# Patient Record
Sex: Female | Born: 1980
Health system: Southern US, Community
[De-identification: ages and names within clinical notes are randomized; demographics above are authoritative.]

## PROBLEM LIST (undated history)

## (undated) DIAGNOSIS — D573 Sickle-cell trait: Secondary | ICD-10-CM

## (undated) DIAGNOSIS — E785 Hyperlipidemia, unspecified: Secondary | ICD-10-CM

## (undated) DIAGNOSIS — J309 Allergic rhinitis, unspecified: Secondary | ICD-10-CM

## (undated) DIAGNOSIS — D649 Anemia, unspecified: Secondary | ICD-10-CM

## (undated) DIAGNOSIS — R87629 Unspecified abnormal cytological findings in specimens from vagina: Secondary | ICD-10-CM

## (undated) DIAGNOSIS — D509 Iron deficiency anemia, unspecified: Secondary | ICD-10-CM

## (undated) DIAGNOSIS — Z8669 Personal history of other diseases of the nervous system and sense organs: Secondary | ICD-10-CM

## (undated) HISTORY — PX: DILATION AND CURETTAGE OF UTERUS: SHX78

## (undated) HISTORY — DX: Allergic rhinitis, unspecified: J30.9

## (undated) HISTORY — PX: TUBAL LIGATION: SHX77

## (undated) HISTORY — DX: Iron deficiency anemia, unspecified: D50.9

## (undated) HISTORY — PX: INCISION AND DRAINAGE PERIRECTAL ABSCESS: SHX1804

## (undated) HISTORY — DX: Personal history of other diseases of the nervous system and sense organs: Z86.69

## (undated) HISTORY — PX: WISDOM TOOTH EXTRACTION: SHX21

---
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
## 2002-08-03 ENCOUNTER — Other Ambulatory Visit: Admission: RE | Admit: 2002-08-03 | Discharge: 2002-08-03 | Payer: Self-pay | Admitting: Obstetrics

## 2002-11-04 ENCOUNTER — Ambulatory Visit (HOSPITAL_COMMUNITY): Admission: RE | Admit: 2002-11-04 | Discharge: 2002-11-04 | Payer: Self-pay | Admitting: Obstetrics

## 2002-11-04 ENCOUNTER — Encounter: Payer: Self-pay | Admitting: Obstetrics

## 2002-11-25 ENCOUNTER — Inpatient Hospital Stay (HOSPITAL_COMMUNITY): Admission: AD | Admit: 2002-11-25 | Discharge: 2002-11-25 | Payer: Self-pay | Admitting: Obstetrics

## 2003-03-03 ENCOUNTER — Inpatient Hospital Stay (HOSPITAL_COMMUNITY): Admission: AD | Admit: 2003-03-03 | Discharge: 2003-03-07 | Payer: Self-pay | Admitting: Obstetrics & Gynecology

## 2003-03-20 ENCOUNTER — Inpatient Hospital Stay (HOSPITAL_COMMUNITY): Admission: AD | Admit: 2003-03-20 | Discharge: 2003-03-22 | Payer: Self-pay | Admitting: Obstetrics

## 2004-03-29 ENCOUNTER — Emergency Department (HOSPITAL_COMMUNITY): Admission: EM | Admit: 2004-03-29 | Discharge: 2004-03-29 | Payer: Self-pay | Admitting: Emergency Medicine

## 2005-04-19 ENCOUNTER — Emergency Department (HOSPITAL_COMMUNITY): Admission: EM | Admit: 2005-04-19 | Discharge: 2005-04-19 | Payer: Self-pay | Admitting: Emergency Medicine

## 2005-10-26 ENCOUNTER — Emergency Department (HOSPITAL_COMMUNITY): Admission: EM | Admit: 2005-10-26 | Discharge: 2005-10-26 | Payer: Self-pay | Admitting: Emergency Medicine

## 2006-11-26 HISTORY — PX: INCISION AND DRAINAGE PERIRECTAL ABSCESS: SHX1804

## 2008-03-08 ENCOUNTER — Inpatient Hospital Stay (HOSPITAL_COMMUNITY): Admission: AD | Admit: 2008-03-08 | Discharge: 2008-03-09 | Payer: Self-pay | Admitting: Surgery

## 2008-11-15 ENCOUNTER — Inpatient Hospital Stay (HOSPITAL_COMMUNITY): Admission: AD | Admit: 2008-11-15 | Discharge: 2008-11-15 | Payer: Self-pay | Admitting: Obstetrics & Gynecology

## 2008-11-17 ENCOUNTER — Inpatient Hospital Stay (HOSPITAL_COMMUNITY): Admission: AD | Admit: 2008-11-17 | Discharge: 2008-11-17 | Payer: Self-pay | Admitting: Obstetrics & Gynecology

## 2008-12-15 ENCOUNTER — Inpatient Hospital Stay (HOSPITAL_COMMUNITY): Admission: AD | Admit: 2008-12-15 | Discharge: 2008-12-15 | Payer: Self-pay | Admitting: Obstetrics

## 2010-02-10 ENCOUNTER — Encounter: Admission: RE | Admit: 2010-02-10 | Discharge: 2010-02-10 | Payer: Self-pay | Admitting: Family Medicine

## 2011-03-12 LAB — URINALYSIS, ROUTINE W REFLEX MICROSCOPIC
Bilirubin Urine: NEGATIVE
Glucose, UA: NEGATIVE mg/dL
Hgb urine dipstick: NEGATIVE
Ketones, ur: >80 mg/dL — AB
Leukocytes, UA: NEGATIVE
Nitrite: NEGATIVE
Protein, ur: 30 mg/dL — AB
Specific Gravity, Urine: >1.03 — ABNORMAL HIGH (ref 1.005–1.030)
Urobilinogen, UA: 0.2 mg/dL (ref 0.0–1.0)
pH: 5.5 (ref 5.0–8.0)

## 2011-03-12 LAB — CBC
HCT: 34.8 % — ABNORMAL LOW (ref 36.0–46.0)
Hemoglobin: 11.1 g/dL — ABNORMAL LOW (ref 12.0–15.0)
MCHC: 31.8 g/dL (ref 30.0–36.0)
MCV: 68.9 fL — ABNORMAL LOW (ref 78.0–100.0)
Platelets: 265 10*3/uL (ref 150–400)
RBC: 5.04 MIL/uL (ref 3.87–5.11)
RDW: 19.4 % — ABNORMAL HIGH (ref 11.5–15.5)
WBC: 6.6 10*3/uL (ref 4.0–10.5)

## 2011-03-12 LAB — COMPREHENSIVE METABOLIC PANEL
Albumin: 4.2 g/dL (ref 3.5–5.2)
Alkaline Phosphatase: 44 U/L (ref 39–117)
BUN: 8 mg/dL (ref 6–23)
CO2: 28 mEq/L (ref 19–32)
Chloride: 105 mEq/L (ref 96–112)
Potassium: 3.6 mEq/L (ref 3.5–5.1)
Total Bilirubin: 0.5 mg/dL (ref 0.3–1.2)

## 2011-03-12 LAB — URINE MICROSCOPIC-ADD ON

## 2011-04-10 NOTE — Op Note (Signed)
NAME:  Katrina Wiley, Katrina Wiley             ACCOUNT NO.:  [id number]   MEDICAL RECORD NO.:  [id number]          PATIENT TYPE:  INP   LOCATION:  1502                         FACILITY:  [facility]   PHYSICIAN:  Sandria Bales. Ezzard Standing, M.D.  DATE OF BIRTH:  [birth year]   DATE OF PROCEDURE:  03/08/2008  DATE OF DISCHARGE:                               OPERATIVE REPORT   Date of surgery ???   PREOPERATIVE DIAGNOSIS:  Left posterior perianal abscess.   POSTOPERATIVE DIAGNOSIS:  Left posterior perianal abscess.   PROCEDURE:  Incision and drainage of left posterior perianal abscess.   SURGEON:  Sandria Bales. Ezzard Standing, MD   ANESTHESIA:  General endotracheal.   ESTIMATED BLOOD LOSS:  50 mL.   DRAINS LEFT:  I left a pack in the wound.   INDICATIONS FOR PROCEDURE:  Ms. Katrina Wiley is a 30 year old black female who  has had a 5 day history of an increasing left posterior buttocks  swelling.  She actually had this in August of 2007.  Apparently is  spontaneously drained.  She sees Dr. Merri Brunette as her primary  medical doctor.  She was seen by Dr. Bertram Savin in an urgent office  today.  Dr. Freida Busman thought it was too big to drain in the office and  asked me to drain it since I was on-call tonight.   The indications and potential complications were explained to the  patient.  Potential complications include bleeding, infection which she  already has, recurrence of this abscess and I told her there was a  chance she had some fissure which is causing recurrence, but I think  this is probably is a true perianal/perirectal abscess.   DESCRIPTION OF PROCEDURE:  The patient was placed in lithotomy position.  A time out held identifying the patient and the procedure.  Her buttocks  was prepped with Betadine solution and sterilely draped.  An incision  was  made into pus which was under pressure.  Cultures were obtained.  I  irrigated the wound with saline, packed with Betadine gauze, serially  dressed it.   She will be  kept overnight, remove the packing tomorrow with plan for  sitz baths 3 times a day.      Sandria Bales. Ezzard Standing, M.D.  Electronically Signed     DHN/MEDQ  D:  03/08/2008  T:  03/08/2008  Job:  [id number]   cc:   Dario Guardian, M.D.  Fax: [phone]

## 2011-04-10 NOTE — H&P (Signed)
NAME:  Katrina Wiley, Lincoln             ACCOUNT NO.:  [id number]   MEDICAL RECORD NO.:  [id number]          PATIENT TYPE:  INP   LOCATION:  1502                         FACILITY:  [facility]   PHYSICIAN:  Lennie Muckle, MD      DATE OF BIRTH:  04/05/1981   DATE OF ADMISSION:  03/08/2008  DATE OF DISCHARGE:                              HISTORY & PHYSICAL   ADMISSION DIAGNOSIS:  Perirectal abscess.   HISTORY OF PRESENT ILLNESS:  Ms. Katrina Wiley is a 30 year old female, who had  apparently 5 days of perirectal pain and swelling.  She had fevers at  home temperature of 99.  She was seen at her physician's office today at  [facility].  It was thought that she had a pilonidal  cyst.  She has had a previous drainage in August of last year; however,  recently again the swelling was approximately 5 days ago, and she has  continued to have pain and swelling in her left buttocks.   PAST MEDICAL HISTORY:  Negative.   SURGICAL HISTORY:  None.   MEDICATIONS:  Takes no medications.   ALLERGIES:  No drug allergies.   SOCIAL HISTORY:  No tobacco or alcohol use.   FAMILY HISTORY:  Negative.   REVIEW OF SYSTEMS:  Negative.   PHYSICAL EXAMINATION:  GENERAL:  She is a pleasant young female, in no  acute distress.  VITAL SIGNS:  Blood pressure 132/75, pulse 111, temp 98.8, and weight is  122.  HEENT:  Extraocular muscles are intact.  CHEST:  Clear to auscultation bilaterally.  CARDIOVASCULAR:  Regular rate and rhythm.  ABDOMEN:  Soft, nontender, and nondistended.  EXTREMITIES:  No deformity or edema.  RECTAL:  Perirectal examination reveals swelling approximately 7 cm in  length and approximately 4 cm in width on the left buttocks.  There is  tenderness with palpation, question of erythema in the area, I see no  tract in the midline for a fistula tract.   ASSESSMENT AND PLAN:  Perirectal abscess.  I have discussed this with  Dr. Ovidio Kin who is on the [facility], and then  she needs  to have incision and drainage of this area as well as perirectal  examination to ensure there is no other fistula tract.  I discussed this  with the patient briefly, and we will have her go to the admitting  office.  She will receive Zosyn preoperatively, and will probably be  able to go home tomorrow following her incision and drainage.      Lennie Muckle, MD  Electronically Signed     ALA/MEDQ  D:  03/08/2008  T:  03/09/2008  Job:  [id number]

## 2011-04-13 NOTE — Discharge Summary (Signed)
NAME:  Katrina Wiley, Katrina Wiley                       ACCOUNT NO.:  [id number]   MEDICAL RECORD NO.:  [id number]                   PATIENT TYPE:  INP   LOCATION:  9159                                 FACILITY:  [facility]   PHYSICIAN:  Charles A. Clearance Coots, M.D.             DATE OF BIRTH:  13-Aug-1981   DATE OF ADMISSION:  03/03/2003  DATE OF DISCHARGE:  03/07/2003                                 DISCHARGE SUMMARY   ADMISSION DIAGNOSES:  1. Gestation at 35 weeks.  2. Leukorrhea.  3. Preterm cervical changes.  4. History of 36 weeks' delivery with previous pregnancy.   DISCHARGE DIAGNOSES:  1. Gestation at 35 weeks.  2. Leukorrhea.  3. Preterm cervical changes.  4. History of 36 weeks' delivery with previous pregnancy.   DISPOSITION:  Discharged home in good condition, not in labor, undelivered  at 36-1/7 weeks' gestation.   REASON FOR ADMISSION:  [age]-year-old G1, P0-1-0-1 black female, estimated  date of confinement of Apr 05, 2003, presented to the office with complaint  of lower back and lower abdominal pain.  On examination was found to be have  moderate amount of leukorrhea and preterm cervical dilatation of 3 cm and  significant cervical effacement.  A decision was made to admit for IV  antibiotic therapy along with bed rest and observation.   PAST SURGICAL HISTORY:  None.   PAST MEDICAL HISTORY:  Sickle cell trait.   MEDICATIONS:  Prenatal vitamins.   ALLERGIES:  No known drug allergies.   FAMILY HISTORY:  Sickle cell trait.   PHYSICAL EXAMINATION:  GENERAL:  Slim, black female in no acute distress.  VITAL SIGNS:  Temperature 99.5, pulse 88, blood pressure 115/70.  LUNGS:  Clear to auscultation bilaterally.  HEART:  Regular rate and rhythm.  ABDOMEN:  Gravid,soft, nontender.  PELVIC:  Normal external female genitalia.  Vagina was moderate grayish  discharge.  Cervix 3+ cm dilated, 80% effaced and the vertex at a -1 to 0  station.  Uterus gravida, soft, nontender.  EXTREMITIES:  Without cyanosis, clubbing or edema.   ADMITTING LABORATORY VALUES:  Wet prep loaded with white blood cells,  negative whiff test.  Hemoglobin 10.5, hematocrit 32.3, white blood cell  count 7900, platelets 147,000.   HOSPITAL COURSE:  The patient was admitted and responded well to IV  antibiotics and IV fluid therapy along with bed rest.  She continued to have  occasional uterine contractions that were mild but did not have any  significant cervical change on examination.  She was therefore discharged  home on hospital day #4, undelivered at 36-1/7 weeks' gestation with  instructions.   DISCHARGE MEDICATIONS:  Continue prenatal vitamins.   DISCHARGE INSTRUCTIONS:  Routine written instructions for obstetrical  booklet were given for diet, and she is to have bed rest modified bed rest.  Follow up at the [facility] on Wednesday, March 10, 2003.  Charles A. Clearance Coots, M.D.    CAH/MEDQ  D:  03/07/2003  T:  03/07/2003  Job:  [id number]   cc:   [facility]  Attn:  Coral Ceo, M.D.

## 2011-04-13 NOTE — Discharge Summary (Signed)
NAME:  Wiley, Katrina N                       ACCOUNT NO.:  [id number]   MEDICAL RECORD NO.:  [id number]                   PATIENT TYPE:  INP   LOCATION:  9323                                 FACILITY:  [facility]   PHYSICIAN:  Charles A. Clearance Coots, M.D.             DATE OF BIRTH:  [birth year]   DATE OF ADMISSION:  03/20/2003  DATE OF DISCHARGE:  03/22/2003                                 DISCHARGE SUMMARY   ADMISSION DIAGNOSIS:  [redacted] weeks gestation, active labor.   DISCHARGE DIAGNOSES:  1. [redacted] weeks gestation, active labor.  2. Status post normal spontaneous vaginal delivery of a viable female on     03/20/03 at 0733, Apgar's at 9 at one minute, 9 at five minutes and weight     of 3170 grams, length of 49.5 cm.  Mother and infant discharged home in     good condition.   REASON FOR ADMISSION:  30 year old black female G2, P1, estimated date of  confinement of 04/05/03 presents with strong regular uterine contractions.  Denied fever, chills, nausea, vomiting, dysuria or rupture of membranes.   OB HISTORY:  Prenatal care was benign and only remarkable for preterm labor  and positive group B strep culture at [redacted] weeks gestation.  The patient had a  previous OB history of preterm labor and delivery at approximately [redacted] weeks  gestation, uncomplicated.   PAST MEDICAL HISTORY:   SURGERY:  None.   ILLNESSES:  None.   MEDICATIONS:  Prenatal vitamins and Unasyn.   ALLERGIES:  No known drug allergies.   SOCIAL HISTORY:  She is single.  Negative tobacco, alcohol or recreational  drug use.   PHYSICAL EXAMINATION:  VITAL SIGNS: Afebrile, blood pressure was 134/79.  ABDOMEN: Soft, gravid, nontender.  CERVICAL EXAM: Cervix is fully dilated, 100% effaced, and vertex at +2  station.   IMPRESSION:  [redacted] weeks gestation, positive group B strep, active labor.   PLAN:  Will try to give Penicillin prophylaxis for group B strep.   ADMITTING LABORATORY VALUES:  Hemoglobin 10.5, hematocrit 32.1, WBC  count  9300, platelets 158,000.  RPR was nonreactive.   HOSPITAL COURSE:  The patient progressed to normal spontaneous vaginal  delivery of a viable female at 0733 on 03/20/03.  There were no intrapartum  complications.  Postpartum course was uncomplicated and the patient was  discharged home on postpartum day number two in good condition.   DISCHARGE LABORATORY VALUES:  Hemoglobin 10, hematocrit 30, WBC count  12,900, platelets 174,000.   DISPOSITION:   MEDICATIONS:  Continue prenatal vitamins, Ibuprofen 800 mg q.8h p.r.n. for  pain.  Routine written instructions were given for diet and activity.  The patient  is to follow up at [facility] in six weeks.  Charles A. Clearance Coots, M.D.    CAH/MEDQ  D:  03/22/2003  T:  03/22/2003  Job:  [id number]

## 2011-08-02 ENCOUNTER — Inpatient Hospital Stay (HOSPITAL_COMMUNITY)
Admission: AD | Admit: 2011-08-02 | Discharge: 2011-08-02 | Disposition: A | Discharge status: Home / Self Care | Payer: 59 | Source: Ambulatory Visit | Attending: Obstetrics & Gynecology | Admitting: Obstetrics & Gynecology

## 2011-08-02 ENCOUNTER — Encounter (HOSPITAL_COMMUNITY): Payer: Self-pay

## 2011-08-02 DIAGNOSIS — O210 Mild hyperemesis gravidarum: Secondary | ICD-10-CM | POA: Insufficient documentation

## 2011-08-02 HISTORY — DX: Anemia, unspecified: D64.9

## 2011-08-02 LAB — COMPREHENSIVE METABOLIC PANEL
ALT: 7 U/L (ref 0–35)
AST: 14 U/L (ref 0–37)
Calcium: 9.9 mg/dL (ref 8.4–10.5)
Sodium: 137 mEq/L (ref 135–145)
Total Protein: 8 g/dL (ref 6.0–8.3)

## 2011-08-02 LAB — URINALYSIS, ROUTINE W REFLEX MICROSCOPIC
Bilirubin Urine: NEGATIVE
Protein, ur: NEGATIVE mg/dL
Urobilinogen, UA: 1 mg/dL (ref 0.0–1.0)

## 2011-08-02 LAB — DIFFERENTIAL
Basophils Absolute: 0 10*3/uL (ref 0.0–0.1)
Eosinophils Absolute: 0 10*3/uL (ref 0.0–0.7)
Eosinophils Relative: 0 % (ref 0–5)

## 2011-08-02 LAB — CBC
MCH: 23.7 pg — ABNORMAL LOW (ref 26.0–34.0)
MCV: 72.2 fL — ABNORMAL LOW (ref 78.0–100.0)
Platelets: 195 10*3/uL (ref 150–400)
RDW: 16.5 % — ABNORMAL HIGH (ref 11.5–15.5)

## 2011-08-02 LAB — URINE MICROSCOPIC-ADD ON

## 2011-08-02 MED ORDER — PROMETHAZINE HCL 25 MG PO TABS: 25.0000 mg | ORAL_TABLET | Freq: Four times a day (QID) | ORAL | Status: AC | PRN

## 2011-08-02 MED ORDER — PROMETHAZINE HCL 25 MG/ML IJ SOLN
25.0000 mg | Freq: Once | INTRAMUSCULAR | Status: AC
  Administered 2011-08-02: 25 mg via INTRAVENOUS
  Filled 2011-08-02: qty 1

## 2011-08-02 MED ORDER — LACTATED RINGERS IV BOLUS (SEPSIS)
1000.0000 mL | Freq: Once | INTRAVENOUS | Status: AC
  Administered 2011-08-02: 1000 mL via INTRAVENOUS

## 2011-08-02 NOTE — Progress Notes (Signed)
Pt states she has had a 15 lb weight loss in 2 weeks. Feels weak and cannot stand up for more than 5 minutes.

## 2011-08-02 NOTE — ED Provider Notes (Signed)
History   Pt presents today c/o severe N&V. She states she has had these sx for the past 2wks and she thinks she has lost about 15lbs. She was seen at a hospital in [place] and was given IV fluids and zofran but she states it did not seem to help. She denies fever, dysuria, abd pain, vag dc or bleeding.  Chief Complaint  Patient presents with  . Emesis During Pregnancy   HPI  OB History    Grav Para Term Preterm Abortions TAB SAB Ect Mult Living   4 2 1 1 1 1    2       Past Medical History  Diagnosis Date  . Preterm labor   . Anemia     Past Surgical History  Procedure Date  . Wisdom tooth extraction   . Dilation and curettage of uterus   . Incision and drainage perirectal abscess     No family history on file.  History  Substance Use Topics  . Smoking status: Never Smoker   . Smokeless tobacco: Never Used  . Alcohol Use: No    Allergies: No Known Allergies  No prescriptions prior to admission    Review of Systems  Constitutional: Negative for fever.  Cardiovascular: Negative for chest pain.  Gastrointestinal: Positive for nausea and vomiting. Negative for abdominal pain and diarrhea.  Genitourinary: Negative for dysuria, urgency, frequency and hematuria.  Neurological: Negative for dizziness and headaches.  Psychiatric/Behavioral: Negative for depression and suicidal ideas.   Physical Exam   Blood pressure 128/90, pulse 109, temperature 99.2 F (37.3 C), temperature source Oral, resp. rate 16, height 5\' 3"  (1.6 m), weight 106 lb 12.8 oz (48.444 kg), last menstrual period 06/09/2011, SpO2 99.00%.  Physical Exam  Constitutional: She is oriented to person, place, and time. She appears well-developed and well-nourished. No distress.  HENT:  Head: Normocephalic and atraumatic.  Eyes: EOM are normal. Pupils are equal, round, and reactive to light.  GI: Soft. She exhibits no distension. There is no tenderness. There is no rebound and no guarding.  Neurological:  She is alert and oriented to person, place, and time.  Skin: Skin is warm and dry. She is not diaphoretic.  Psychiatric: She has a normal mood and affect. Her behavior is normal. Judgment and thought content normal.    MAU Course  Procedures  Results for orders placed during the hospital encounter of 08/02/11 (from the past 24 hour(s))  URINALYSIS, ROUTINE W REFLEX MICROSCOPIC     Status: Abnormal   Collection Time   08/02/11  1:10 PM      Component Value Range   Color, Urine YELLOW  YELLOW    Appearance CLOUDY (*) CLEAR    Specific Gravity, Urine >1.030 (*) 1.005 - 1.030    pH 6.0  5.0 - 8.0    Glucose, UA NEGATIVE  NEGATIVE (mg/dL)   Hgb urine dipstick TRACE (*) NEGATIVE    Bilirubin Urine NEGATIVE  NEGATIVE    Ketones, ur >80 (*) NEGATIVE (mg/dL)   Protein, ur NEGATIVE  NEGATIVE (mg/dL)   Urobilinogen, UA 1.0  0.0 - 1.0 (mg/dL)   Nitrite NEGATIVE  NEGATIVE    Leukocytes, UA NEGATIVE  NEGATIVE   URINE MICROSCOPIC-ADD ON     Status: Abnormal   Collection Time   08/02/11  1:10 PM      Component Value Range   Squamous Epithelial / LPF MANY (*) RARE    WBC, UA 0-2  <3 (WBC/hpf)   Bacteria, UA MANY (*)  RARE    Urine-Other MUCOUS PRESENT    CBC     Status: Abnormal   Collection Time   08/02/11  1:30 PM      Component Value Range   WBC 7.2  4.0 - 10.5 (K/uL)   RBC 5.03  3.87 - 5.11 (MIL/uL)   Hemoglobin 11.9 (*) 12.0 - 15.0 (g/dL)   HCT 16.1  09.6 - 04.5 (%)   MCV 72.2 (*) 78.0 - 100.0 (fL)   MCH 23.7 (*) 26.0 - 34.0 (pg)   MCHC 32.8  30.0 - 36.0 (g/dL)   RDW 40.9 (*) 81.1 - 15.5 (%)   Platelets 195  150 - 400 (K/uL)  DIFFERENTIAL     Status: Normal   Collection Time   08/02/11  1:30 PM      Component Value Range   Neutrophils Relative 74  43 - 77 (%)   Neutro Abs 5.3  1.7 - 7.7 (K/uL)   Lymphocytes Relative 20  12 - 46 (%)   Lymphs Abs 1.5  0.7 - 4.0 (K/uL)   Monocytes Relative 6  3 - 12 (%)   Monocytes Absolute 0.4  0.1 - 1.0 (K/uL)   Eosinophils Relative 0  0 - 5 (%)    Eosinophils Absolute 0.0  0.0 - 0.7 (K/uL)   Basophils Relative 0  0 - 1 (%)   Basophils Absolute 0.0  0.0 - 0.1 (K/uL)  COMPREHENSIVE METABOLIC PANEL     Status: Abnormal   Collection Time   08/02/11  1:30 PM      Component Value Range   Sodium 137  135 - 145 (mEq/L)   Potassium 3.3 (*) 3.5 - 5.1 (mEq/L)   Chloride 100  96 - 112 (mEq/L)   CO2 25  19 - 32 (mEq/L)   Glucose, Bld 71  70 - 99 (mg/dL)   BUN 10  6 - 23 (mg/dL)   Creatinine, Ser <9.14 (*) 0.50 - 1.10 (mg/dL)   Calcium 9.9  8.4 - 78.2 (mg/dL)   Total Protein 8.0  6.0 - 8.3 (g/dL)   Albumin 4.3  3.5 - 5.2 (g/dL)   AST 14  0 - 37 (U/L)   ALT 7  0 - 35 (U/L)   Alkaline Phosphatase 54  39 - 117 (U/L)   Total Bilirubin 0.6  0.3 - 1.2 (mg/dL)   GFR calc non Af Amer NOT CALCULATED  >60 (mL/min)   GFR calc Af Amer NOT CALCULATED  >60 (mL/min)   IV hydration and antiemetics started.  Pt feels much improved following IV hydration and antiemetics.  Assessment and Plan  Hyperemesis: discussed with pt at length. Will give Rx for phenergan. Discussed diet, activity, risks, and precautions.  Clinton Gallant. Nels Munn III, DrHSc, MPAS, PA-C  08/02/2011, 3:03 PM   Henrietta Hoover, PA 08/02/11 1746

## 2011-08-02 NOTE — Progress Notes (Signed)
Pt states she gets prenatal care in [place]. Has had nausea and vomiting for 2 weeks, worse for the past 3 days. Was in the ER in [place] on 9-4 and was given IVF but pt states they did not stop the vomiting. Has Zofran but pt states it does not work. Low to mid back pain. No bleeding but a heavier discharge.

## 2011-08-02 NOTE — Progress Notes (Signed)
Pt denies s/s of uti, has been to [facility] where pt rcvd ivfluids. Denies vag d/c changes or bleeding.

## 2011-08-02 NOTE — ED Notes (Signed)
Pt feels better from ivpush phenergan, resting comfortably. Call light in reach

## 2011-08-03 LAB — URINE CULTURE
Culture  Setup Time: 201209070153
Special Requests: NORMAL

## 2011-08-21 LAB — ANAEROBIC CULTURE

## 2011-08-21 LAB — CULTURE, ROUTINE-ABSCESS

## 2011-08-30 LAB — URINALYSIS, ROUTINE W REFLEX MICROSCOPIC
Glucose, UA: NEGATIVE mg/dL
Ketones, ur: NEGATIVE mg/dL
Specific Gravity, Urine: 1.025 (ref 1.005–1.030)
pH: 6 (ref 5.0–8.0)

## 2011-08-30 LAB — WET PREP, GENITAL
Trich, Wet Prep: NONE SEEN
Yeast Wet Prep HPF POC: NONE SEEN

## 2011-08-30 LAB — HCG, QUANTITATIVE, PREGNANCY: hCG, Beta Chain, Quant, S: 264 m[IU]/mL — ABNORMAL HIGH (ref <5)

## 2013-02-13 ENCOUNTER — Encounter: Payer: Self-pay | Admitting: *Deleted

## 2013-02-16 ENCOUNTER — Ambulatory Visit: Payer: Self-pay | Admitting: Obstetrics

## 2013-03-31 ENCOUNTER — Ambulatory Visit: Payer: Self-pay | Admitting: Obstetrics

## 2013-05-26 ENCOUNTER — Telehealth: Payer: Self-pay | Admitting: *Deleted

## 2013-05-26 NOTE — Telephone Encounter (Signed)
Pt left message requesting either Flagyl or cream for BV. Pt last seen 07/11/2010.

## 2013-08-21 ENCOUNTER — Encounter: Payer: Self-pay | Admitting: Obstetrics & Gynecology

## 2013-11-30 ENCOUNTER — Ambulatory Visit: Payer: 59 | Admitting: Obstetrics

## 2013-12-03 ENCOUNTER — Ambulatory Visit (INDEPENDENT_AMBULATORY_CARE_PROVIDER_SITE_OTHER): Payer: Medicaid Other | Admitting: Obstetrics

## 2013-12-03 ENCOUNTER — Encounter: Payer: Self-pay | Admitting: Obstetrics

## 2013-12-03 VITALS — BP 119/79 | HR 95 | Temp 100.1°F | Ht 63.0 in | Wt 117.0 lb

## 2013-12-03 DIAGNOSIS — Z0000 Encounter for general adult medical examination without abnormal findings: Secondary | ICD-10-CM

## 2013-12-03 DIAGNOSIS — Z113 Encounter for screening for infections with a predominantly sexual mode of transmission: Secondary | ICD-10-CM

## 2013-12-03 NOTE — Progress Notes (Signed)
  Subjective:     Katrina Wiley is a 33 y.o. woman who comes in today for a  pap smear only. Her most recent annual exam was on 2013. Her most recent Pap smear was on 2013 and showed no abnormalities. Previous abnormal Pap smears: yes - 03/2012. Contraception: tubal ligation  The following portions of the patient's history were reviewed and updated as appropriate: allergies, current medications, past family history, past medical history, past social history, past surgical history and problem list.  Review of Systems Pertinent items are noted in HPI.   Objective:    BP 119/79  Pulse 95  Temp(Src) 100.1 F (37.8 C)  Ht 5\' 3"  (1.6 m)  Wt 117 lb (53.071 kg)  BMI 20.73 kg/m2  LMP 11/17/2013  Breastfeeding? Unknown Pelvic Exam: cervix normal in appearance, external genitalia normal, no adnexal masses or tenderness, uterus normal size, shape, and consistency and vagina normal without discharge. Pap smear obtained.   Assessment:    Screening pap smear.   Plan:    Follow up in 1 year, or as indicated by Pap results.

## 2013-12-04 LAB — WET PREP BY MOLECULAR PROBE
Candida species: NEGATIVE
Gardnerella vaginalis: POSITIVE — AB
Trichomonas vaginosis: NEGATIVE

## 2013-12-04 LAB — PAP IG W/ RFLX HPV ASCU

## 2013-12-04 LAB — GC/CHLAMYDIA PROBE AMP
CT Probe RNA: NEGATIVE
GC PROBE AMP APTIMA: NEGATIVE

## 2013-12-07 ENCOUNTER — Other Ambulatory Visit: Payer: Self-pay | Admitting: *Deleted

## 2013-12-07 DIAGNOSIS — N760 Acute vaginitis: Secondary | ICD-10-CM

## 2013-12-07 DIAGNOSIS — B9689 Other specified bacterial agents as the cause of diseases classified elsewhere: Secondary | ICD-10-CM

## 2013-12-07 MED ORDER — METRONIDAZOLE 500 MG PO TABS: 500.0000 mg | ORAL_TABLET | Freq: Two times a day (BID) | ORAL | Status: DC

## 2014-01-20 ENCOUNTER — Other Ambulatory Visit: Payer: Self-pay | Admitting: *Deleted

## 2014-01-20 DIAGNOSIS — N760 Acute vaginitis: Secondary | ICD-10-CM

## 2014-01-20 DIAGNOSIS — B9689 Other specified bacterial agents as the cause of diseases classified elsewhere: Secondary | ICD-10-CM

## 2014-01-20 MED ORDER — METRONIDAZOLE 0.75 % VA GEL: 1.0000 | Freq: Every day | VAGINAL | Status: DC

## 2014-05-05 ENCOUNTER — Telehealth: Payer: Self-pay | Admitting: *Deleted

## 2014-05-05 DIAGNOSIS — B373 Candidiasis of vulva and vagina: Secondary | ICD-10-CM

## 2014-05-05 DIAGNOSIS — B3731 Acute candidiasis of vulva and vagina: Secondary | ICD-10-CM

## 2014-05-05 MED ORDER — FLUCONAZOLE 150 MG PO TABS: 150.0000 mg | ORAL_TABLET | Freq: Every day | ORAL | Status: DC

## 2014-05-05 NOTE — Telephone Encounter (Signed)
Patient is calling requesting Diflucan. 6:52 Patient is c/o itching and burning. Rx sent to pharmacy per office protocol.

## 2014-09-27 ENCOUNTER — Encounter: Payer: Self-pay | Admitting: Obstetrics

## 2015-01-05 ENCOUNTER — Encounter: Payer: Self-pay | Admitting: Obstetrics

## 2015-01-05 ENCOUNTER — Ambulatory Visit (INDEPENDENT_AMBULATORY_CARE_PROVIDER_SITE_OTHER): Payer: Medicaid Other | Admitting: Obstetrics

## 2015-01-05 VITALS — BP 112/73 | HR 81 | Temp 98.7°F | Ht 63.0 in | Wt 119.0 lb

## 2015-01-05 DIAGNOSIS — B373 Candidiasis of vulva and vagina: Secondary | ICD-10-CM

## 2015-01-05 DIAGNOSIS — B3731 Acute candidiasis of vulva and vagina: Secondary | ICD-10-CM

## 2015-01-05 DIAGNOSIS — T733XXA Exhaustion due to excessive exertion, initial encounter: Secondary | ICD-10-CM

## 2015-01-05 DIAGNOSIS — Z124 Encounter for screening for malignant neoplasm of cervix: Secondary | ICD-10-CM

## 2015-01-05 DIAGNOSIS — Z01419 Encounter for gynecological examination (general) (routine) without abnormal findings: Secondary | ICD-10-CM

## 2015-01-05 LAB — CBC
HCT: 31.2 % — ABNORMAL LOW (ref 36.0–46.0)
HEMOGLOBIN: 9 g/dL — AB (ref 12.0–15.0)
MCH: 19.1 pg — AB (ref 26.0–34.0)
MCHC: 28.8 g/dL — ABNORMAL LOW (ref 30.0–36.0)
MCV: 66.1 fL — ABNORMAL LOW (ref 78.0–100.0)
MPV: 9.3 fL (ref 8.6–12.4)
PLATELETS: 355 10*3/uL (ref 150–400)
RBC: 4.72 MIL/uL (ref 3.87–5.11)
RDW: 18.3 % — ABNORMAL HIGH (ref 11.5–15.5)
WBC: 4.1 10*3/uL (ref 4.0–10.5)

## 2015-01-05 MED ORDER — FLUCONAZOLE 100 MG PO TABS: 100.0000 mg | ORAL_TABLET | Freq: Once | ORAL | Status: DC

## 2015-01-06 ENCOUNTER — Encounter: Payer: Self-pay | Admitting: Obstetrics

## 2015-01-06 LAB — RPR

## 2015-01-06 LAB — HIV ANTIBODY (ROUTINE TESTING W REFLEX): HIV 1&2 Ab, 4th Generation: NONREACTIVE

## 2015-01-06 NOTE — Progress Notes (Signed)
Subjective:        Katrina Wiley is a 34 y.o. female here for a routine exam.  Current complaints: None.    Personal health questionnaire:  Is patient [name], have a family history of breast and/or ovarian cancer: no Is there a family history of uterine cancer diagnosed at age < 61, gastrointestinal cancer, urinary tract cancer, family member who is a Personnel officer syndrome-associated carrier: no Is the patient overweight and hypertensive, family history of diabetes, personal history of gestational diabetes, preeclampsia or PCOS: no Is patient over 56, have PCOS,  family history of premature CHD under age 39, diabetes, smoke, have hypertension or peripheral artery disease:  no At any time, has a partner hit, kicked or otherwise hurt or frightened you?: no Over the past 2 weeks, have you felt down, depressed or hopeless?: no Over the past 2 weeks, have you felt little interest or pleasure in doing things?:no   Gynecologic History Patient's last menstrual period was 12/27/2014. Contraception: tubal ligation Last Pap: 2015. Results were: normal Last mammogram: n/a. Results were: n/a  Obstetric History OB History  Gravida Para Term Preterm AB SAB TAB Ectopic Multiple Living  # Outcome Date GA Lbr Len/2nd Weight Sex Delivery Anes PTL Lv  4 Gravida              Comments: System Generated. Please review and update pregnancy details.  3 TAB           2 Preterm           1 Term               Past Medical History  Diagnosis Date  . Preterm labor   . Anemia     Past Surgical History  Procedure Laterality Date  . Wisdom tooth extraction    . Dilation and curettage of uterus    . Incision and drainage perirectal abscess    . Tubal ligation       Current outpatient prescriptions:  .  clindamycin-benzoyl peroxide (BENZACLIN) gel, Apply topically 2 (two) times daily., Disp: , Rfl:  .  fluconazole (DIFLUCAN) 100 MG tablet, Take 1 tablet (100 mg total)  by mouth once. Repeat in 48-72 hours., Disp: 2 tablet, Rfl: 0 No Known Allergies  History  Substance Use Topics  . Smoking status: Never Smoker   . Smokeless tobacco: Never Used  . Alcohol Use: No    History reviewed. No pertinent family history.    Review of Systems  Constitutional: negative for fatigue and weight loss Respiratory: negative for cough and wheezing Cardiovascular: negative for chest pain, fatigue and palpitations Gastrointestinal: negative for abdominal pain and change in bowel habits Musculoskeletal:negative for myalgias Neurological: negative for gait problems and tremors Behavioral/Psych: negative for abusive relationship, depression Endocrine: negative for temperature intolerance   Genitourinary:negative for abnormal menstrual periods, genital lesions, hot flashes, sexual problems and vaginal discharge Integument/breast: negative for breast lump, breast tenderness, nipple discharge and skin lesion(s)    Objective:       BP 112/73 mmHg  Pulse 81  Temp(Src) 98.7 F (37.1 C)  Ht  (1.6 m)  Wt 119 lb (53.978 kg)  BMI 21.09 kg/m2  LMP 12/27/2014 General:   alert  Skin:   no rash or abnormalities  Lungs:   clear to auscultation bilaterally  Heart:   regular rate and rhythm, S1, S2 normal, no murmur, click, rub or  gallop  Breasts:   normal without suspicious masses, skin or nipple changes or axillary nodes  Abdomen:  normal findings: no organomegaly, soft, non-tender and no hernia  Pelvis:  External genitalia: normal general appearance Urinary system: urethral meatus normal and bladder without fullness, nontender Vaginal: normal without tenderness, induration or masses Cervix: normal appearance Adnexa: normal bimanual exam Uterus: anteverted and non-tender, normal size   Lab Review Urine pregnancy test Labs reviewed yes Radiologic studies reviewed no    Assessment:    Healthy female exam.    Candida vaginitis   Plan:    Education  reviewed: low fat, low cholesterol diet, safe sex/STD prevention, self breast exams and weight bearing exercise. Follow up in: 1 year.   Meds ordered this encounter  Medications  . clindamycin-benzoyl peroxide (BENZACLIN) gel    Sig: Apply topically 2 (two) times daily.  . fluconazole (DIFLUCAN) 100 MG tablet    Sig: Take 1 tablet (100 mg total) by mouth once. Repeat in 48-72 hours.    Dispense:  2 tablet    Refill:  0   Orders Placed This Encounter  Procedures  . SureSwab, Vaginosis/Vaginitis Plus  . CBC  . HIV antibody  . RPR

## 2015-01-09 LAB — SURESWAB, VAGINOSIS/VAGINITIS PLUS
Atopobium vaginae: NOT DETECTED Log (cells/mL)
C. GLABRATA, DNA: NOT DETECTED
C. PARAPSILOSIS, DNA: NOT DETECTED
C. TROPICALIS, DNA: NOT DETECTED
C. albicans, DNA: DETECTED — AB
C. trachomatis RNA, TMA: NOT DETECTED
GARDNERELLA VAGINALIS: NOT DETECTED Log (cells/mL)
LACTOBACILLUS SPECIES: 8 Log (cells/mL)
MEGASPHAERA SPECIES: NOT DETECTED Log (cells/mL)
N. gonorrhoeae RNA, TMA: NOT DETECTED
T. VAGINALIS RNA, QL TMA: NOT DETECTED

## 2015-01-11 ENCOUNTER — Other Ambulatory Visit: Payer: Self-pay | Admitting: Certified Nurse Midwife

## 2015-01-11 LAB — PAP IG AND HPV HIGH-RISK: HPV DNA High Risk: DETECTED — AB

## 2015-02-02 ENCOUNTER — Other Ambulatory Visit: Payer: Self-pay

## 2015-02-14 ENCOUNTER — Other Ambulatory Visit: Payer: Self-pay | Admitting: *Deleted

## 2015-02-14 DIAGNOSIS — B9689 Other specified bacterial agents as the cause of diseases classified elsewhere: Secondary | ICD-10-CM

## 2015-02-14 DIAGNOSIS — N760 Acute vaginitis: Secondary | ICD-10-CM

## 2015-02-14 MED ORDER — TINIDAZOLE 250 MG PO TABS: 500.0000 mg | ORAL_TABLET | Freq: Every day | ORAL | Status: DC

## 2015-02-14 NOTE — Progress Notes (Signed)
Pt called to office with symptoms of BV.  Pt made aware that could be treated per protocol.  Tinidazole 500mg  was sent to pharmacy.   Pt advised to contact office if any problems.

## 2015-03-30 ENCOUNTER — Telehealth: Payer: Self-pay | Admitting: *Deleted

## 2015-03-30 DIAGNOSIS — B3731 Acute candidiasis of vulva and vagina: Secondary | ICD-10-CM

## 2015-03-30 DIAGNOSIS — B373 Candidiasis of vulva and vagina: Secondary | ICD-10-CM

## 2015-03-30 MED ORDER — FLUCONAZOLE 100 MG PO TABS: 100.0000 mg | ORAL_TABLET | Freq: Once | ORAL | Status: DC

## 2015-03-30 NOTE — Telephone Encounter (Signed)
Patient is requesting Diflucan for yeast. She is having a burning sensation and some irritation. Patient just started her cycle- so she is not sure about her discharge. 1:48 Per protocol- Rx refilled. Patient notified per VM.

## 2015-05-04 ENCOUNTER — Ambulatory Visit: Payer: Self-pay | Admitting: Certified Nurse Midwife

## 2015-09-13 ENCOUNTER — Encounter: Payer: Self-pay | Admitting: Obstetrics

## 2015-09-13 ENCOUNTER — Ambulatory Visit (INDEPENDENT_AMBULATORY_CARE_PROVIDER_SITE_OTHER): Payer: Medicaid Other | Admitting: Obstetrics

## 2015-09-13 VITALS — BP 124/84 | HR 93 | Temp 98.6°F | Ht 63.0 in | Wt 119.0 lb

## 2015-09-13 DIAGNOSIS — N898 Other specified noninflammatory disorders of vagina: Secondary | ICD-10-CM

## 2015-09-13 DIAGNOSIS — R399 Unspecified symptoms and signs involving the genitourinary system: Secondary | ICD-10-CM | POA: Diagnosis not present

## 2015-09-13 MED ORDER — NITROFURANTOIN MONOHYD MACRO 100 MG PO CAPS: 100.0000 mg | ORAL_CAPSULE | Freq: Two times a day (BID) | ORAL | Status: DC

## 2015-09-13 NOTE — Progress Notes (Signed)
Patient ID: Katrina Wiley, female   DOB: March 10, 1981, 34 y.o.   MRN: [id number]  Chief Complaint  Patient presents with  . Urinary Tract Infection  . Vaginitis    HPI Katrina Wiley is a 34 y.o. female.  Painful urination and blood in urine.  Also notes increased vaginal discharge. HPI  Past Medical History  Diagnosis Date  . Preterm labor   . Anemia     Past Surgical History  Procedure Laterality Date  . Wisdom tooth extraction    . Dilation and curettage of uterus    . Incision and drainage perirectal abscess    . Tubal ligation      History reviewed. No pertinent family history.  Social History Social History  Substance Use Topics  . Smoking status: Never Smoker   . Smokeless tobacco: Never Used  . Alcohol Use: No    No Known Allergies  Current Outpatient Prescriptions  Medication Sig Dispense Refill  . clindamycin-benzoyl peroxide (BENZACLIN) gel Apply topically 2 (two) times daily.    . nitrofurantoin, macrocrystal-monohydrate, (MACROBID) 100 MG capsule Take 1 capsule (100 mg total) by mouth 2 (two) times daily. 1 po BID x 7days 14 capsule 2   No current facility-administered medications for this visit.    Review of Systems Review of Systems Constitutional: negative for fatigue and weight loss Respiratory: negative for cough and wheezing Cardiovascular: negative for chest pain, fatigue and palpitations Gastrointestinal: negative for abdominal pain and change in bowel habits Genitourinary: pain with urination and vaginal discharge Integument/breast: negative for nipple discharge Musculoskeletal:negative for myalgias Neurological: negative for gait problems and tremors Behavioral/Psych: negative for abusive relationship, depression Endocrine: negative for temperature intolerance     Blood pressure 124/84, pulse 93, temperature 98.6 F (37 C), height 5\' 3"  (1.6 m), weight 119 lb (53.978 kg), last menstrual period 09/01/2015, unknown if currently  breastfeeding.  Physical Exam Physical Exam            General:  Alert and no distress Abdomen:  normal findings: no organomegaly, soft, non-tender and no hernia  Pelvis:  External genitalia: normal general appearance Urinary system: urethral meatus normal and bladder without fullness, nontender Vaginal: normal without tenderness, induration or masses Cervix: normal appearance Adnexa: normal bimanual exam Uterus: anteverted and non-tender, normal size      Data Reviewed Labs  Assessment     UTI Vaginal Discharge     Plan    Macrobid Rx Wet prep sent.  Will treat with results.  Orders Placed This Encounter  Procedures  . Urine culture  . SureSwab, Vaginosis/Vaginitis Plus  . POCT urinalysis dipstick   Meds ordered this encounter  Medications  . nitrofurantoin, macrocrystal-monohydrate, (MACROBID) 100 MG capsule    Sig: Take 1 capsule (100 mg total) by mouth 2 (two) times daily. 1 po BID x 7days    Dispense:  14 capsule    Refill:  2

## 2015-09-15 ENCOUNTER — Other Ambulatory Visit: Payer: Self-pay | Admitting: Obstetrics

## 2015-09-15 LAB — URINE CULTURE: Colony Count: >=100000

## 2015-09-18 LAB — SURESWAB, VAGINOSIS/VAGINITIS PLUS
ATOPOBIUM VAGINAE: NOT DETECTED Log (cells/mL)
BV CATEGORY: UNDETERMINED — AB
C. ALBICANS, DNA: DETECTED — AB
C. GLABRATA, DNA: NOT DETECTED
C. PARAPSILOSIS, DNA: NOT DETECTED
C. trachomatis RNA, TMA: NOT DETECTED
C. tropicalis, DNA: NOT DETECTED
Gardnerella vaginalis: 7.2 Log (cells/mL)
LACTOBACILLUS SPECIES: 7.5 Log (cells/mL)
MEGASPHAERA SPECIES: NOT DETECTED Log (cells/mL)
N. GONORRHOEAE RNA, TMA: NOT DETECTED
T. vaginalis RNA, QL TMA: NOT DETECTED

## 2015-09-19 ENCOUNTER — Other Ambulatory Visit: Payer: Self-pay | Admitting: Obstetrics

## 2015-09-19 DIAGNOSIS — N760 Acute vaginitis: Secondary | ICD-10-CM

## 2015-09-19 DIAGNOSIS — B379 Candidiasis, unspecified: Secondary | ICD-10-CM

## 2015-09-19 DIAGNOSIS — B9689 Other specified bacterial agents as the cause of diseases classified elsewhere: Secondary | ICD-10-CM

## 2015-09-19 MED ORDER — METRONIDAZOLE 500 MG PO TABS: 500.0000 mg | ORAL_TABLET | Freq: Two times a day (BID) | ORAL | Status: DC

## 2015-09-19 MED ORDER — FLUCONAZOLE 150 MG PO TABS: 150.0000 mg | ORAL_TABLET | Freq: Once | ORAL | Status: DC

## 2015-09-22 ENCOUNTER — Telehealth: Payer: Self-pay | Admitting: *Deleted

## 2015-09-22 DIAGNOSIS — B373 Candidiasis of vulva and vagina: Secondary | ICD-10-CM

## 2015-09-22 DIAGNOSIS — B3731 Acute candidiasis of vulva and vagina: Secondary | ICD-10-CM

## 2015-09-22 MED ORDER — TERCONAZOLE 0.4 % VA CREA: 1.0000 | TOPICAL_CREAM | Freq: Every day | VAGINAL | Status: DC

## 2015-09-22 NOTE — Telephone Encounter (Signed)
Patient states she was treated for UTI and yeast/BV. Patient states she thinks her symptoms are coming back she is having some itching.  1:40 Patient states she has itching which Dr Clearance CootsHarper says we can treat with Terazol this time. She is feeling slight discomfort at urethra- told patient to continue to push her fluids and keep the acidity of the bladder up.Call if she gets worse. She did take the correct antibiotic.

## 2015-09-26 ENCOUNTER — Telehealth: Payer: Self-pay | Admitting: *Deleted

## 2015-09-26 NOTE — Telephone Encounter (Signed)
Patient contacted the office stating her UTI symptoms are returning. Attempted to contact the patient and phone is not accepting incoming calls.

## 2015-11-03 ENCOUNTER — Telehealth: Payer: Self-pay | Admitting: *Deleted

## 2015-11-03 NOTE — Telephone Encounter (Signed)
Patient is calling with questions about OCP that are designed for lighter,shorter cycles with decreased acne. LM on VM- explained LO LO Estrin and Seasonique. Patient to call back if needed.

## 2015-11-04 ENCOUNTER — Telehealth: Payer: Self-pay | Admitting: *Deleted

## 2015-11-04 NOTE — Telephone Encounter (Signed)
Patient states she spoke with a nurse on 11-03-15 regarding different birth control pills. Patient states she has decided she would like to try the Lo lo estrin. Patient advised to start the pill the Sunday after her menstrual cycle. Patient advised would send request to the doctor for approval.

## 2015-11-05 ENCOUNTER — Other Ambulatory Visit: Payer: Self-pay | Admitting: Obstetrics

## 2015-11-05 DIAGNOSIS — Z30011 Encounter for initial prescription of contraceptive pills: Secondary | ICD-10-CM

## 2015-11-05 MED ORDER — NORETHIN ACE-ETH ESTRAD-FE 1-20 MG-MCG(24) PO TABS: 1.0000 | ORAL_TABLET | Freq: Every day | ORAL | Status: DC

## 2015-11-05 NOTE — Telephone Encounter (Signed)
Loestrin 24 Fe Rx 

## 2015-11-07 NOTE — Telephone Encounter (Signed)
Left message for patient to notify her that her prescription has been sent to the pharmacy.

## 2016-01-09 ENCOUNTER — Ambulatory Visit: Payer: Managed Care, Other (non HMO) | Admitting: Obstetrics

## 2016-01-25 ENCOUNTER — Telehealth: Payer: Self-pay | Admitting: *Deleted

## 2016-01-25 NOTE — Telephone Encounter (Signed)
Patient contacted the office requesting a prescription for Diflucan. Patient has recently been on an antibiotic for strep throat and is getting a yeast infection.  Attempted to contact the patient and left message for patient to call the office.

## 2016-01-30 ENCOUNTER — Ambulatory Visit: Payer: Medicaid Other | Admitting: Obstetrics

## 2016-02-08 ENCOUNTER — Ambulatory Visit (INDEPENDENT_AMBULATORY_CARE_PROVIDER_SITE_OTHER): Payer: Medicaid Other | Admitting: Obstetrics

## 2016-02-08 ENCOUNTER — Encounter: Payer: Self-pay | Admitting: Obstetrics

## 2016-02-08 VITALS — BP 121/83 | HR 80 | Temp 98.8°F | Ht 63.0 in | Wt 121.0 lb

## 2016-02-08 DIAGNOSIS — Z01419 Encounter for gynecological examination (general) (routine) without abnormal findings: Secondary | ICD-10-CM

## 2016-02-08 DIAGNOSIS — B373 Candidiasis of vulva and vagina: Secondary | ICD-10-CM

## 2016-02-08 DIAGNOSIS — Z0000 Encounter for general adult medical examination without abnormal findings: Secondary | ICD-10-CM | POA: Diagnosis not present

## 2016-02-08 DIAGNOSIS — B3731 Acute candidiasis of vulva and vagina: Secondary | ICD-10-CM

## 2016-02-08 MED ORDER — FLUCONAZOLE 150 MG PO TABS: 150.0000 mg | ORAL_TABLET | Freq: Once | ORAL | Status: DC

## 2016-02-09 ENCOUNTER — Encounter: Payer: Self-pay | Admitting: Obstetrics

## 2016-02-09 NOTE — Telephone Encounter (Signed)
Patient has been in the office since call.

## 2016-02-09 NOTE — Progress Notes (Signed)
Subjective:        Katrina Wiley is a 35 y.o. female here for a routine exam.  Current complaints: None.    Personal health questionnaire:  Is patient [name], have a family history of breast and/or ovarian cancer: no Is there a family history of uterine cancer diagnosed at age < 3850, gastrointestinal cancer, urinary tract cancer, family member who is a Personnel officerLynch syndrome-associated carrier: no Is the patient overweight and hypertensive, family history of diabetes, personal history of gestational diabetes, preeclampsia or PCOS: no Is patient over 4055, have PCOS,  family history of premature CHD under age 34, diabetes, smoke, have hypertension or peripheral artery disease:  no At any time, has a partner hit, kicked or otherwise hurt or frightened you?: no Over the past 2 weeks, have you felt down, depressed or hopeless?: no Over the past 2 weeks, have you felt little interest or pleasure in doing things?:no   Gynecologic History Patient's last menstrual period was 02/08/2016. Contraception: OCP (estrogen/progesterone) Last Pap: 2016. Results were: Positive HPV  Last mammogram: n/a. Results were: n/a  Obstetric History OB History  Gravida Para Term Preterm AB SAB TAB Ectopic Multiple Living  4 2 1 1 1  1   2     # Outcome Date GA Lbr Len/2nd Weight Sex Delivery Anes PTL Lv  4 Gravida              Comments: System Generated. Please review and update pregnancy details.  3 TAB           2 Preterm           1 Term               Past Medical History  Diagnosis Date  . Preterm labor   . Anemia     Past Surgical History  Procedure Laterality Date  . Wisdom tooth extraction    . Dilation and curettage of uterus    . Incision and drainage perirectal abscess    . Tubal ligation       Current outpatient prescriptions:  .  metroNIDAZOLE (FLAGYL) 500 MG tablet, Take 1 tablet (500 mg total) by mouth 2 (two) times daily., Disp: 14 tablet, Rfl: 2 .  Norethindrone  Acetate-Ethinyl Estrad-FE (LOESTRIN 24 FE) 1-20 MG-MCG(24) tablet, Take 1 tablet by mouth daily., Disp: 1 Package, Rfl: 11 .  clindamycin-benzoyl peroxide (BENZACLIN) gel, Apply topically 2 (two) times daily. Reported on 02/08/2016, Disp: , Rfl:  .  fluconazole (DIFLUCAN) 150 MG tablet, Take 1 tablet (150 mg total) by mouth once., Disp: 1 tablet, Rfl: 2 .  terconazole (TERAZOL 7) 0.4 % vaginal cream, Place 1 applicator vaginally at bedtime. (Patient not taking: Reported on 02/08/2016), Disp: 45 g, Rfl: 0 No Known Allergies  Social History  Substance Use Topics  . Smoking status: Never Smoker   . Smokeless tobacco: Never Used  . Alcohol Use: 0.0 oz/week    0 Standard drinks or equivalent per week     Comment: occ    History reviewed. No pertinent family history.    Review of Systems  Constitutional: negative for fatigue and weight loss Respiratory: negative for cough and wheezing Cardiovascular: negative for chest pain, fatigue and palpitations Gastrointestinal: negative for abdominal pain and change in bowel habits Musculoskeletal:negative for myalgias Neurological: negative for gait problems and tremors Behavioral/Psych: negative for abusive relationship, depression Endocrine: negative for temperature intolerance   Genitourinary:negative for abnormal menstrual periods, genital lesions, hot flashes, sexual problems and  vaginal discharge Integument/breast: negative for breast lump, breast tenderness, nipple discharge and skin lesion(s)    Objective:       BP 121/83 mmHg  Pulse 80  Temp(Src) 98.8 F (37.1 C)  Ht  (1.6 m)  Wt 121 lb (54.885 kg)  BMI 21.44 kg/m2  LMP 02/08/2016 General:   alert  Skin:   no rash or abnormalities  Lungs:   clear to auscultation bilaterally  Heart:   regular rate and rhythm, S1, S2 normal, no murmur, click, rub or gallop  Breasts:   normal without suspicious masses, skin or nipple changes or axillary nodes  Abdomen:  normal findings: no  organomegaly, soft, non-tender and no hernia  Pelvis:  External genitalia: normal general appearance Urinary system: urethral meatus normal and bladder without fullness, nontender Vaginal: normal without tenderness, induration or masses Cervix: normal appearance Adnexa: normal bimanual exam Uterus: anteverted and non-tender, normal size   Lab Review Urine pregnancy test Labs reviewed yes Radiologic studies reviewed no    Assessment:    Healthy female exam.    Contraceptive surveillance   Plan:    Education reviewed: low fat, low cholesterol diet, safe sex/STD prevention, self breast exams and weight bearing exercise. Contraception: OCP (estrogen/progesterone). Follow up in: 1 year.   Meds ordered this encounter  Medications  . fluconazole (DIFLUCAN) 150 MG tablet    Sig: Take 1 tablet (150 mg total) by mouth once.    Dispense:  1 tablet    Refill:  2   Orders Placed This Encounter  Procedures  . NuSwab Vaginitis Plus (VG+)

## 2016-02-10 LAB — PAP IG AND HPV HIGH-RISK
HPV, HIGH-RISK: NEGATIVE
PAP Smear Comment: 0

## 2016-02-11 ENCOUNTER — Other Ambulatory Visit: Payer: Self-pay | Admitting: Obstetrics

## 2016-02-11 DIAGNOSIS — N760 Acute vaginitis: Secondary | ICD-10-CM

## 2016-02-11 DIAGNOSIS — B9689 Other specified bacterial agents as the cause of diseases classified elsewhere: Secondary | ICD-10-CM

## 2016-02-11 LAB — NUSWAB VAGINITIS PLUS (VG+)
ATOPOBIUM VAGINAE: HIGH {score} — AB
BVAB 2: HIGH Score — AB
Candida albicans, NAA: NEGATIVE
Candida glabrata, NAA: NEGATIVE
Chlamydia trachomatis, NAA: NEGATIVE
Megasphaera 1: HIGH Score — AB
Neisseria gonorrhoeae, NAA: NEGATIVE
Trich vag by NAA: NEGATIVE

## 2016-02-11 MED ORDER — METRONIDAZOLE 500 MG PO TABS: 500.0000 mg | ORAL_TABLET | Freq: Two times a day (BID) | ORAL | Status: DC

## 2016-02-13 ENCOUNTER — Ambulatory Visit: Payer: Medicaid Other | Admitting: Obstetrics

## 2016-04-12 ENCOUNTER — Other Ambulatory Visit: Payer: Self-pay | Admitting: *Deleted

## 2016-04-12 DIAGNOSIS — Z3041 Encounter for surveillance of contraceptive pills: Secondary | ICD-10-CM

## 2016-04-12 MED ORDER — NORETHIN ACE-ETH ESTRAD-FE 1-20 MG-MCG(24) PO CAPS: 1.0000 | ORAL_CAPSULE | Freq: Every day | ORAL | Status: DC

## 2016-10-03 ENCOUNTER — Telehealth: Payer: Self-pay | Admitting: *Deleted

## 2016-10-03 NOTE — Telephone Encounter (Signed)
Patient states she has yeast and would like treatment.

## 2016-10-04 ENCOUNTER — Other Ambulatory Visit: Payer: Self-pay | Admitting: Obstetrics

## 2016-10-04 DIAGNOSIS — B373 Candidiasis of vulva and vagina: Secondary | ICD-10-CM

## 2016-10-04 DIAGNOSIS — B3731 Acute candidiasis of vulva and vagina: Secondary | ICD-10-CM

## 2016-10-04 MED ORDER — FLUCONAZOLE 150 MG PO TABS: 150.0000 mg | ORAL_TABLET | Freq: Once | ORAL | 2 refills | Status: AC

## 2016-10-04 NOTE — Telephone Encounter (Signed)
Diflucan Rx 

## 2017-01-21 ENCOUNTER — Emergency Department (HOSPITAL_COMMUNITY)
Admission: EM | Admit: 2017-01-21 | Discharge: 2017-01-21 | Disposition: A | Discharge status: Home / Self Care | Payer: Medicaid Other | Attending: Emergency Medicine | Admitting: Emergency Medicine

## 2017-01-21 ENCOUNTER — Encounter (HOSPITAL_COMMUNITY): Payer: Self-pay | Admitting: Emergency Medicine

## 2017-01-21 DIAGNOSIS — N920 Excessive and frequent menstruation with regular cycle: Secondary | ICD-10-CM

## 2017-01-21 DIAGNOSIS — R109 Unspecified abdominal pain: Secondary | ICD-10-CM | POA: Diagnosis present

## 2017-01-21 DIAGNOSIS — D509 Iron deficiency anemia, unspecified: Secondary | ICD-10-CM | POA: Diagnosis not present

## 2017-01-21 DIAGNOSIS — R1032 Left lower quadrant pain: Secondary | ICD-10-CM

## 2017-01-21 LAB — URINALYSIS, ROUTINE W REFLEX MICROSCOPIC
Bilirubin Urine: NEGATIVE
GLUCOSE, UA: NEGATIVE mg/dL
Ketones, ur: NEGATIVE mg/dL
Leukocytes, UA: NEGATIVE
NITRITE: NEGATIVE
PH: 6 (ref 5.0–8.0)
Protein, ur: NEGATIVE mg/dL
Specific Gravity, Urine: 1.023 (ref 1.005–1.030)

## 2017-01-21 LAB — COMPREHENSIVE METABOLIC PANEL
ALT: 9 U/L — ABNORMAL LOW (ref 14–54)
AST: 17 U/L (ref 15–41)
Albumin: 4.2 g/dL (ref 3.5–5.0)
Alkaline Phosphatase: 46 U/L (ref 38–126)
Anion gap: 7 (ref 5–15)
BILIRUBIN TOTAL: 0.5 mg/dL (ref 0.3–1.2)
BUN: 14 mg/dL (ref 6–20)
CALCIUM: 9.3 mg/dL (ref 8.9–10.3)
CO2: 26 mmol/L (ref 22–32)
Chloride: 107 mmol/L (ref 101–111)
Creatinine, Ser: 0.62 mg/dL (ref 0.44–1.00)
GFR calc non Af Amer: >60 mL/min (ref >60)
Glucose, Bld: 125 mg/dL — ABNORMAL HIGH (ref 65–99)
Potassium: 3.2 mmol/L — ABNORMAL LOW (ref 3.5–5.1)
Sodium: 140 mmol/L (ref 135–145)
TOTAL PROTEIN: 7.6 g/dL (ref 6.5–8.1)

## 2017-01-21 LAB — CBC
HCT: 27.5 % — ABNORMAL LOW (ref 36.0–46.0)
HEMOGLOBIN: 8.1 g/dL — AB (ref 12.0–15.0)
MCH: 18.9 pg — ABNORMAL LOW (ref 26.0–34.0)
MCHC: 29.5 g/dL — AB (ref 30.0–36.0)
MCV: 64.3 fL — ABNORMAL LOW (ref 78.0–100.0)
Platelets: 195 10*3/uL (ref 150–400)
RBC: 4.28 MIL/uL (ref 3.87–5.11)
RDW: 19.2 % — ABNORMAL HIGH (ref 11.5–15.5)
WBC: 3.8 10*3/uL — ABNORMAL LOW (ref 4.0–10.5)

## 2017-01-21 LAB — WET PREP, GENITAL
Clue Cells Wet Prep HPF POC: NONE SEEN
SPERM: NONE SEEN
TRICH WET PREP: NONE SEEN
Yeast Wet Prep HPF POC: NONE SEEN

## 2017-01-21 LAB — I-STAT BETA HCG BLOOD, ED (MC, WL, AP ONLY): I-stat hCG, quantitative: <5 m[IU]/mL (ref <5)

## 2017-01-21 LAB — LIPASE, BLOOD: Lipase: 25 U/L (ref 11–51)

## 2017-01-21 MED ORDER — FERROUS SULFATE 325 (65 FE) MG PO TABS: 325.0000 mg | ORAL_TABLET | Freq: Two times a day (BID) | ORAL | 0 refills | Status: DC

## 2017-01-21 NOTE — ED Triage Notes (Signed)
Pt reports L side abd pain since Saturday accompanied by nausea. No emesis/diarrhea. Pt reports it feels like L side of abd is swollen. Tried to go to PCP, but could not get appointment with PCP until next week. LPM Thursday. LBM this am.

## 2017-01-21 NOTE — ED Provider Notes (Signed)
[facility]-EMERGENCY DEPT Provider Note   CSN: [id number] Arrival date & time: 01/21/17  1355     History   Chief Complaint Chief Complaint  Patient presents with  . Abdominal Pain    HPI Katrina Wiley is a 36 y.o. female.  Pt presents to the ED today with abdominal pain.  The pt said that she noticed some left side pain on the 24th.  She had some nausea and noticed a bulge in her llq.  The pt said she feels fine now.  She called her pcp who told her to come to the ED as she could not get an appt with her pcp until next week.      Past Medical History:  Diagnosis Date  . Anemia   . Preterm labor     There are no active problems to display for this patient.   Past Surgical History:  Procedure Laterality Date  . DILATION AND CURETTAGE OF UTERUS    . INCISION AND DRAINAGE PERIRECTAL ABSCESS    . TUBAL LIGATION    . WISDOM TOOTH EXTRACTION      OB History    Gravida Para Term Preterm AB Living   4 2 1 1 1 2    SAB TAB Ectopic Multiple Live Births     1             Home Medications    Prior to Admission medications   Medication Sig Start Date End Date Taking? Authorizing Provider  clindamycin-benzoyl peroxide (BENZACLIN) gel Apply topically 2 (two) times daily. Reported on 02/08/2016    Historical Provider, MD  ferrous sulfate 325 (65 FE) MG tablet Take 1 tablet (325 mg total) by mouth 2 (two) times daily with a meal. 01/21/17   Jacalyn Lefevre, MD  metroNIDAZOLE (FLAGYL) 500 MG tablet Take 1 tablet (500 mg total) by mouth 2 (two) times daily. 02/11/16   Brock Bad, MD  Norethin Ace-Eth Estrad-FE (TAYTULLA) 1-20 MG-MCG(24) CAPS Take 1 capsule by mouth daily. 04/12/16   Brock Bad, MD  terconazole (TERAZOL 7) 0.4 % vaginal cream Place 1 applicator vaginally at bedtime. Patient not taking: Reported on 02/08/2016 09/22/15   Brock Bad, MD    Family History History reviewed. No pertinent family history.  Social History Social History  Substance Use  Topics  . Smoking status: Never Smoker  . Smokeless tobacco: Never Used  . Alcohol use 0.0 oz/week     Comment: occ     Allergies   Patient has no known allergies.   Review of Systems Review of Systems  Gastrointestinal: Positive for abdominal pain.  All other systems reviewed and are negative.    Physical Exam Updated Vital Signs BP 114/87 (BP Location: Left Arm)   Pulse 95   Temp 98.4 F (36.9 C) (Oral)   Resp 24   SpO2 100%   Physical Exam  Constitutional: She is oriented to person, place, and time. She appears well-developed and well-nourished.  HENT:  Head: Normocephalic and atraumatic.  Right Ear: External ear normal.  Left Ear: External ear normal.  Nose: Nose normal.  Mouth/Throat: Oropharynx is clear and moist.  Eyes: Conjunctivae and EOM are normal. Pupils are equal, round, and reactive to light.  Neck: Normal range of motion. Neck supple.  Cardiovascular: Normal rate, regular rhythm, normal heart sounds and intact distal pulses.   Pulmonary/Chest: Effort normal and breath sounds normal.  Abdominal: Soft. Bowel sounds are normal.  Genitourinary: Uterus normal. Cervix exhibits no discharge.  Right adnexum displays no mass, no tenderness and no fullness. Left adnexum displays no mass, no tenderness and no fullness. There is bleeding in the vagina.  Musculoskeletal: Normal range of motion.  Neurological: She is alert and oriented to person, place, and time.  Skin: Skin is warm and dry.  Psychiatric: She has a normal mood and affect. Her behavior is normal. Judgment and thought content normal.  Nursing note and vitals reviewed.    ED Treatments / Results  Labs (all labs ordered are listed, but only abnormal results are displayed) Labs Reviewed  WET PREP, GENITAL - Abnormal; Notable for the following:       Result Value   WBC, Wet Prep HPF POC RARE (*)    All other components within normal limits  COMPREHENSIVE METABOLIC PANEL - Abnormal; Notable for the  following:    Potassium 3.2 (*)    Glucose, Bld 125 (*)    ALT 9 (*)    All other components within normal limits  CBC - Abnormal; Notable for the following:    WBC 3.8 (*)    Hemoglobin 8.1 (*)    HCT 27.5 (*)    MCV 64.3 (*)    MCH 18.9 (*)    MCHC 29.5 (*)    RDW 19.2 (*)    All other components within normal limits  URINALYSIS, ROUTINE W REFLEX MICROSCOPIC - Abnormal; Notable for the following:    APPearance HAZY (*)    Hgb urine dipstick LARGE (*)    Bacteria, UA RARE (*)    Squamous Epithelial / LPF 6-30 (*)    All other components within normal limits  LIPASE, BLOOD  I-STAT BETA HCG BLOOD, ED ([facility], [facility], [facility] ONLY)  GC/CHLAMYDIA PROBE AMP ([place]) NOT AT [facility]    EKG  EKG Interpretation None       Radiology No results found.  Procedures Procedures (including critical care time)  Medications Ordered in ED Medications - No data to display   Initial Impression / Assessment and Plan / ED Course  I have reviewed the triage vital signs and the nursing notes.  Pertinent labs & imaging results that were available during my care of the patient were reviewed by me and considered in my medical decision making (see chart for details).    Pt said she does have heavy periods.  It was very heavy on 2/23.  The pt also has a hx of anemia, but has not been taking her iron.     Pt has no tenderness now.  Exam is nl.  Pt to be started on iron for her anemia.  Pt knows to f/u with obgyn and with pcp.  Return if worse.  Final Clinical Impressions(s) / ED Diagnoses   Final diagnoses:  Left lower quadrant pain  Iron deficiency anemia, unspecified iron deficiency anemia type  Menorrhagia with regular cycle    New Prescriptions New Prescriptions   FERROUS SULFATE 325 (65 FE) MG TABLET    Take 1 tablet (325 mg total) by mouth 2 (two) times daily with a meal.     Jacalyn LefevreJulie Kjersten [place]rm[place], MD 01/21/17 [id number]

## 2017-01-22 LAB — GC/CHLAMYDIA PROBE AMP (~~LOC~~) NOT AT ARMC
Chlamydia: NEGATIVE
NEISSERIA GONORRHEA: NEGATIVE

## 2017-02-11 ENCOUNTER — Ambulatory Visit: Payer: Self-pay | Admitting: Obstetrics

## 2017-02-13 ENCOUNTER — Ambulatory Visit: Payer: Medicaid Other | Admitting: Obstetrics

## 2017-03-08 ENCOUNTER — Ambulatory Visit: Payer: Medicaid Other | Admitting: Obstetrics

## 2017-03-29 ENCOUNTER — Other Ambulatory Visit (HOSPITAL_COMMUNITY)
Admission: RE | Admit: 2017-03-29 | Discharge: 2017-03-29 | Disposition: A | Discharge status: Home / Self Care | Payer: Medicaid Other | Source: Ambulatory Visit | Attending: Obstetrics | Admitting: Obstetrics

## 2017-03-29 ENCOUNTER — Encounter: Payer: Self-pay | Admitting: Obstetrics

## 2017-03-29 ENCOUNTER — Ambulatory Visit (INDEPENDENT_AMBULATORY_CARE_PROVIDER_SITE_OTHER): Payer: Medicaid Other | Admitting: Obstetrics

## 2017-03-29 DIAGNOSIS — Z01419 Encounter for gynecological examination (general) (routine) without abnormal findings: Secondary | ICD-10-CM

## 2017-03-29 DIAGNOSIS — Z124 Encounter for screening for malignant neoplasm of cervix: Secondary | ICD-10-CM

## 2017-03-29 DIAGNOSIS — Z0000 Encounter for general adult medical examination without abnormal findings: Secondary | ICD-10-CM | POA: Diagnosis not present

## 2017-03-29 DIAGNOSIS — N898 Other specified noninflammatory disorders of vagina: Secondary | ICD-10-CM

## 2017-03-29 DIAGNOSIS — D500 Iron deficiency anemia secondary to blood loss (chronic): Secondary | ICD-10-CM

## 2017-03-29 DIAGNOSIS — N939 Abnormal uterine and vaginal bleeding, unspecified: Secondary | ICD-10-CM

## 2017-03-29 MED ORDER — NORETHIN ACE-ETH ESTRAD-FE 1-20 MG-MCG(24) PO CAPS: 1.0000 | ORAL_CAPSULE | Freq: Every day | ORAL | 11 refills | Status: DC

## 2017-03-29 MED ORDER — CITRANATAL BLOOM 90-1 MG PO TABS: 1.0000 | ORAL_TABLET | Freq: Every day | ORAL | 11 refills | Status: DC

## 2017-03-29 NOTE — Progress Notes (Signed)
Subjective:        Katrina Wiley is a 36 y.o. female here for a routine exam.  Current complaints: None.    Personal health questionnaire:  Is patient [name], have a family history of breast and/or ovarian cancer: no Is there a family history of uterine cancer diagnosed at age < 1050, gastrointestinal cancer, urinary tract cancer, family member who is a Personnel officerLynch syndrome-associated carrier: no Is the patient overweight and hypertensive, family history of diabetes, personal history of gestational diabetes, preeclampsia or PCOS: no Is patient over 8455, have PCOS,  family history of premature CHD under age 35, diabetes, smoke, have hypertension or peripheral artery disease:  no At any time, has a partner hit, kicked or otherwise hurt or frightened you?: no Over the past 2 weeks, have you felt down, depressed or hopeless?: no Over the past 2 weeks, have you felt little interest or pleasure in doing things?:no   Gynecologic History Patient's last menstrual period was 03/14/2017. Contraception: OCP (estrogen/progesterone) Last Pap: 2017. Results were: normal Last mammogram: n/a. Results were: n/a  Obstetric History OB History  Gravida Para Term Preterm AB Living  4 2 1 1 1 2   SAB TAB Ectopic Multiple Live Births    1          # Outcome Date GA Lbr Len/2nd Weight Sex Delivery Anes PTL Lv  4 Gravida              Birth Comments: System Generated. Please review and update pregnancy details.  3 TAB           2 Preterm           1 Term               Past Medical History:  Diagnosis Date  . Anemia   . Preterm labor     Past Surgical History:  Procedure Laterality Date  . DILATION AND CURETTAGE OF UTERUS    . INCISION AND DRAINAGE PERIRECTAL ABSCESS    . TUBAL LIGATION    . WISDOM TOOTH EXTRACTION       Current Outpatient Prescriptions:  .  norethindrone-ethinyl estradiol (JUNEL FE,GILDESS FE,LOESTRIN FE) 1-20 MG-MCG tablet, Take 1 tablet by mouth daily., Disp: ,  Rfl:  .  clindamycin-benzoyl peroxide (BENZACLIN) gel, Apply topically 2 (two) times daily. Reported on 02/08/2016, Disp: , Rfl:  .  ferrous sulfate 325 (65 FE) MG tablet, Take 1 tablet (325 mg total) by mouth 2 (two) times daily with a meal., Disp: 60 tablet, Rfl: 0 .  metroNIDAZOLE (FLAGYL) 500 MG tablet, Take 1 tablet (500 mg total) by mouth 2 (two) times daily., Disp: 14 tablet, Rfl: 2 .  Norethin Ace-Eth Estrad-FE (TAYTULLA) 1-20 MG-MCG(24) CAPS, Take 1 capsule by mouth daily., Disp: 28 capsule, Rfl: 11 .  terconazole (TERAZOL 7) 0.4 % vaginal cream, Place 1 applicator vaginally at bedtime. (Patient not taking: Reported on 02/08/2016), Disp: 45 g, Rfl: 0 No Known Allergies  Social History  Substance Use Topics  . Smoking status: Never Smoker  . Smokeless tobacco: Never Used  . Alcohol use 0.0 oz/week     Comment: occ    History reviewed. No pertinent family history.    Review of Systems  Constitutional: negative for fatigue and weight loss Respiratory: negative for cough and wheezing Cardiovascular: negative for chest pain, fatigue and palpitations Gastrointestinal: negative for abdominal pain and change in bowel habits Musculoskeletal:negative for myalgias Neurological: negative for gait problems and tremors Behavioral/Psych:  negative for abusive relationship, depression Endocrine: negative for temperature intolerance    Genitourinary:negative for abnormal menstrual periods, genital lesions, hot flashes, sexual problems and vaginal discharge Integument/breast: negative for breast lump, breast tenderness, nipple discharge and skin lesion(s)    Objective:       BP 112/77   Pulse 91   Ht 5\' 3"  (1.6 m)   Wt 118 lb 4.8 oz (53.7 kg)   LMP 03/14/2017   BMI 20.96 kg/m  General:   alert  Skin:   no rash or abnormalities  Lungs:   clear to auscultation bilaterally  Heart:   regular rate and rhythm, S1, S2 normal, no murmur, click, rub or gallop  Breasts:   normal without  suspicious masses, skin or nipple changes or axillary nodes  Abdomen:  normal findings: no organomegaly, soft, non-tender and no hernia  Pelvis:  External genitalia: normal general appearance Urinary system: urethral meatus normal and bladder without fullness, nontender Vaginal: normal without tenderness, induration or masses Cervix: normal appearance Adnexa: normal bimanual exam Uterus: anteverted and non-tender, normal size   Lab Review Urine pregnancy test Labs reviewed yes Radiologic studies reviewed no  50% of 20 min visit spent on counseling and coordination of care.    Assessment:    Healthy female exam.    Plan:    Education reviewed: calcium supplements, depression evaluation, low fat, low cholesterol diet, safe sex/STD prevention, self breast exams and weight bearing exercise. Contraception: OCP (estrogen/progesterone). Follow up in: 1 year.   Meds ordered this encounter  Medications  . norethindrone-ethinyl estradiol (JUNEL FE,GILDESS FE,LOESTRIN FE) 1-20 MG-MCG tablet    Sig: Take 1 tablet by mouth daily.   No orders of the defined types were placed in this encounter.    Patient ID: Katrina Wiley, female   DOB: 08/27/1981, 36 y.o.   MRN: [id number]

## 2017-03-29 NOTE — Progress Notes (Signed)
Patient is in the office for annual exam, wants std testing with blood work included. Pt currently has Junel BC pills to lighten her periods, but has not started taking them.

## 2017-04-01 LAB — CERVICOVAGINAL ANCILLARY ONLY
Bacterial vaginitis: NEGATIVE
Candida vaginitis: NEGATIVE
Chlamydia: NEGATIVE
NEISSERIA GONORRHEA: NEGATIVE
Trichomonas: NEGATIVE

## 2017-04-02 LAB — CYTOLOGY - PAP
Diagnosis: NEGATIVE
HPV: NOT DETECTED

## 2017-04-03 LAB — RPR+HSVIGM+HBSAG+HSV2(IGG)+...
HIV SCREEN 4TH GENERATION: NONREACTIVE
HSVI/II Comb IgM: 2.09 Ratio — ABNORMAL HIGH (ref 0.00–0.90)
Hepatitis B Surface Ag: NEGATIVE
RPR Ser Ql: NONREACTIVE

## 2017-04-09 ENCOUNTER — Telehealth: Payer: Self-pay | Admitting: *Deleted

## 2017-04-09 NOTE — Telephone Encounter (Signed)
Pt called back to office for lab results.  Return call to pt. Pt was made aware of results.

## 2017-04-09 NOTE — Telephone Encounter (Signed)
Pt called to office stating she has questions about lab results on MyChart.  Attempt to contact pt. LM on VM to call back.

## 2017-04-10 ENCOUNTER — Telehealth: Payer: Self-pay | Admitting: *Deleted

## 2017-04-10 NOTE — Telephone Encounter (Signed)
Pt called to office stating that Dr Clearance CootsHarper discussed an outpatient procedure she could have done. Pt states she would like procedure.  Attempt to contact pt regarding procedure, to get more information. No notes seen in last visit regarding procedure discussion.  LM on VM to call office.

## 2017-04-10 NOTE — Telephone Encounter (Signed)
Spoke with pt regarding procedure she is interested in. Pt states she was given a brochure on procedure, ? Ablation. Pt states she had discussion of removing lining of uterus. Pt made aware request with be sent to provider for recommendations. Pt advised she may need an appt in order to discuss further with surgical provider.    Please advise on procedure and need for appt.

## 2017-05-01 ENCOUNTER — Ambulatory Visit: Payer: Medicaid Other | Admitting: Obstetrics and Gynecology

## 2017-05-07 ENCOUNTER — Encounter: Payer: Self-pay | Admitting: Obstetrics & Gynecology

## 2017-05-07 ENCOUNTER — Ambulatory Visit (INDEPENDENT_AMBULATORY_CARE_PROVIDER_SITE_OTHER): Payer: Medicaid Other | Admitting: Obstetrics & Gynecology

## 2017-05-07 VITALS — BP 133/78 | HR 88 | Ht 63.0 in | Wt 123.0 lb

## 2017-05-07 DIAGNOSIS — N920 Excessive and frequent menstruation with regular cycle: Secondary | ICD-10-CM

## 2017-05-07 NOTE — Progress Notes (Signed)
Pt wishes to discuss Ablation.

## 2017-05-07 NOTE — Progress Notes (Signed)
   Subjective:    Patient ID: Katrina Wiley, female    DOB: [birth year], 36 y.o.   MRN: [id number]  HPI  36 yo S P3 (17, 7414, and 995 yo kids) here today to discuss her interest in an ablation. She describes heavy periods, lasting 6 days, and anemia. She takes iron. She has taken OCPs for 1 month in the past in an attempt to lighten her periods but she had BTB. Her hbg in 2/18 was 8.1. She has had a BTL. Her periods are not terribly painful.  Review of Systems She is a Sales executivedental assistant, [employer]    Objective:   Physical Exam  WNWHBFNAD Breathing, conversing, and ambulating normally       Assessment & Plan:  Menorrhagia with anemia- schedule gyn u/s If no contraindications found on u/s, then I agree with the plan for d&c and ablation.

## 2017-05-08 ENCOUNTER — Encounter (HOSPITAL_COMMUNITY): Payer: Self-pay

## 2017-05-10 ENCOUNTER — Encounter (HOSPITAL_COMMUNITY): Payer: Self-pay

## 2017-05-14 ENCOUNTER — Ambulatory Visit (HOSPITAL_COMMUNITY)
Admission: RE | Admit: 2017-05-14 | Discharge: 2017-05-14 | Disposition: A | Discharge status: Home / Self Care | Payer: Medicaid Other | Source: Ambulatory Visit | Attending: Obstetrics & Gynecology | Admitting: Obstetrics & Gynecology

## 2017-05-14 DIAGNOSIS — N920 Excessive and frequent menstruation with regular cycle: Secondary | ICD-10-CM | POA: Diagnosis not present

## 2017-05-21 ENCOUNTER — Ambulatory Visit (INDEPENDENT_AMBULATORY_CARE_PROVIDER_SITE_OTHER): Payer: Medicaid Other | Admitting: Obstetrics & Gynecology

## 2017-05-21 VITALS — BP 102/77 | HR 80 | Wt 120.3 lb

## 2017-05-21 DIAGNOSIS — D500 Iron deficiency anemia secondary to blood loss (chronic): Secondary | ICD-10-CM | POA: Diagnosis not present

## 2017-05-21 DIAGNOSIS — N920 Excessive and frequent menstruation with regular cycle: Secondary | ICD-10-CM | POA: Diagnosis not present

## 2017-05-21 NOTE — Progress Notes (Signed)
   Subjective:    Patient ID: Katrina Wiley, female    DOB: 09/20/1981, 36 y.o.   MRN: [id number]  HPI 36 yo S P3 here to schedule an endometrial ablation. She has tried OCPs but still bleeds. Her u/s was normal. She has had a BTL in the past.   Review of Systems Pap normal 5/18    Objective:   Physical Exam  WNWHBFNAD Breathing, conversing, and ambulating normally       Assessment & Plan:  Menorrhagia with anemia, normal u/s- will plan for d&c and endometrial ablation

## 2017-05-22 ENCOUNTER — Ambulatory Visit: Admit: 2017-05-22 | Payer: Medicaid Other | Admitting: Obstetrics & Gynecology

## 2017-05-22 SURGERY — ABLATION, ENDOMETRIUM
Anesthesia: Choice

## 2017-06-06 NOTE — Patient Instructions (Addendum)
Your procedure is scheduled on:  THURSDAY, JULY 19  Enter through the Main Entrance of [facility] at:  10 am  Pick up the phone at the desk and dial [phone].  Call this number if you have problems the morning of surgery: [phone].  Remember: Do NOT eat food or drink clear liquids (including water) after:  midnight Wednesday  Take these medicines the morning of surgery with a SIP OF WATER:  None  Stop herbal medications and supplements at this time.  Do NOT wear jewelry (body piercing), metal hair clips/bobby pins, make-up, or nail polish. Do NOT wear lotions, powders, or perfumes.  You may wear deoderant. Do NOT shave for 48 hours prior to surgery. Do NOT bring valuables to the hospital. Contacts may not be worn into surgery.  Have a responsible adult drive you home and stay with you for 24 hours after your procedure.  Home with Fayrene FearingJames cell [phone].

## 2017-06-07 ENCOUNTER — Encounter (HOSPITAL_COMMUNITY)
Admission: RE | Admit: 2017-06-07 | Discharge: 2017-06-07 | Disposition: A | Discharge status: Home / Self Care | Payer: Medicaid Other | Source: Ambulatory Visit | Attending: Obstetrics & Gynecology | Admitting: Obstetrics & Gynecology

## 2017-06-07 ENCOUNTER — Encounter (HOSPITAL_COMMUNITY): Payer: Self-pay

## 2017-06-07 DIAGNOSIS — D649 Anemia, unspecified: Secondary | ICD-10-CM | POA: Insufficient documentation

## 2017-06-07 DIAGNOSIS — Z01812 Encounter for preprocedural laboratory examination: Secondary | ICD-10-CM | POA: Insufficient documentation

## 2017-06-07 HISTORY — DX: Unspecified abnormal cytological findings in specimens from vagina: R87.629

## 2017-06-07 HISTORY — DX: Hyperlipidemia, unspecified: E78.5

## 2017-06-07 LAB — CBC
HCT: 31 % — ABNORMAL LOW (ref 36.0–46.0)
Hemoglobin: 9.9 g/dL — ABNORMAL LOW (ref 12.0–15.0)
MCH: 21.5 pg — AB (ref 26.0–34.0)
MCHC: 31.9 g/dL (ref 30.0–36.0)
MCV: 67.4 fL — ABNORMAL LOW (ref 78.0–100.0)
Platelets: 210 10*3/uL (ref 150–400)
RBC: 4.6 MIL/uL (ref 3.87–5.11)
RDW: 20.8 % — ABNORMAL HIGH (ref 11.5–15.5)
WBC: 4.7 10*3/uL (ref 4.0–10.5)

## 2017-06-13 ENCOUNTER — Encounter (HOSPITAL_COMMUNITY)
Admission: RE | Disposition: A | Discharge status: Home / Self Care | Payer: Self-pay | Source: Ambulatory Visit | Attending: Obstetrics & Gynecology

## 2017-06-13 ENCOUNTER — Ambulatory Visit (HOSPITAL_COMMUNITY): Payer: Medicaid Other | Admitting: Anesthesiology

## 2017-06-13 ENCOUNTER — Encounter (HOSPITAL_COMMUNITY): Payer: Self-pay | Admitting: *Deleted

## 2017-06-13 ENCOUNTER — Ambulatory Visit (HOSPITAL_COMMUNITY)
Admission: RE | Admit: 2017-06-13 | Discharge: 2017-06-13 | Disposition: A | Discharge status: Home / Self Care | Payer: Medicaid Other | Source: Ambulatory Visit | Attending: Obstetrics & Gynecology | Admitting: Obstetrics & Gynecology

## 2017-06-13 DIAGNOSIS — E785 Hyperlipidemia, unspecified: Secondary | ICD-10-CM | POA: Diagnosis not present

## 2017-06-13 DIAGNOSIS — N938 Other specified abnormal uterine and vaginal bleeding: Secondary | ICD-10-CM | POA: Insufficient documentation

## 2017-06-13 DIAGNOSIS — Z79899 Other long term (current) drug therapy: Secondary | ICD-10-CM | POA: Insufficient documentation

## 2017-06-13 DIAGNOSIS — D500 Iron deficiency anemia secondary to blood loss (chronic): Secondary | ICD-10-CM | POA: Insufficient documentation

## 2017-06-13 DIAGNOSIS — N920 Excessive and frequent menstruation with regular cycle: Secondary | ICD-10-CM | POA: Diagnosis not present

## 2017-06-13 HISTORY — PX: DILITATION & CURRETTAGE/HYSTROSCOPY WITH HYDROTHERMAL ABLATION: SHX5570

## 2017-06-13 LAB — PREGNANCY, URINE: Preg Test, Ur: NEGATIVE

## 2017-06-13 SURGERY — DILATATION & CURETTAGE/HYSTEROSCOPY WITH HYDROTHERMAL ABLATION
Anesthesia: General

## 2017-06-13 MED ORDER — KETOROLAC TROMETHAMINE 30 MG/ML IJ SOLN
INTRAMUSCULAR | Status: DC | PRN
  Administered 2017-06-13: 30 mg via INTRAVENOUS

## 2017-06-13 MED ORDER — OXYCODONE-ACETAMINOPHEN 5-325 MG PO TABS: 1.0000 | ORAL_TABLET | Freq: Four times a day (QID) | ORAL | 0 refills | Status: DC | PRN

## 2017-06-13 MED ORDER — SCOPOLAMINE 1 MG/3DAYS TD PT72
1.0000 | MEDICATED_PATCH | Freq: Once | TRANSDERMAL | Status: DC
  Administered 2017-06-13: 1.5 mg via TRANSDERMAL

## 2017-06-13 MED ORDER — FENTANYL CITRATE (PF) 100 MCG/2ML IJ SOLN
INTRAMUSCULAR | Status: AC
  Filled 2017-06-13: qty 4

## 2017-06-13 MED ORDER — DEXAMETHASONE SODIUM PHOSPHATE 10 MG/ML IJ SOLN
INTRAMUSCULAR | Status: DC | PRN
  Administered 2017-06-13: 10 mg via INTRAVENOUS

## 2017-06-13 MED ORDER — FENTANYL CITRATE (PF) 100 MCG/2ML IJ SOLN
INTRAMUSCULAR | Status: AC
  Administered 2017-06-13: 25 ug via INTRAVENOUS
  Filled 2017-06-13: qty 2

## 2017-06-13 MED ORDER — PROPOFOL 10 MG/ML IV BOLUS
INTRAVENOUS | Status: DC | PRN
  Administered 2017-06-13: 200 mg via INTRAVENOUS

## 2017-06-13 MED ORDER — ONDANSETRON HCL 4 MG/2ML IJ SOLN
INTRAMUSCULAR | Status: DC | PRN
  Administered 2017-06-13: 4 mg via INTRAVENOUS

## 2017-06-13 MED ORDER — BUPIVACAINE HCL (PF) 0.25 % IJ SOLN
INTRAMUSCULAR | Status: AC
  Filled 2017-06-13: qty 30

## 2017-06-13 MED ORDER — DEXAMETHASONE SODIUM PHOSPHATE 10 MG/ML IJ SOLN
INTRAMUSCULAR | Status: AC
  Filled 2017-06-13: qty 1

## 2017-06-13 MED ORDER — BUPIVACAINE HCL (PF) 0.5 % IJ SOLN
INTRAMUSCULAR | Status: DC | PRN
  Administered 2017-06-13: 30 mL

## 2017-06-13 MED ORDER — GLYCOPYRROLATE 0.2 MG/ML IJ SOLN
INTRAMUSCULAR | Status: AC
  Filled 2017-06-13: qty 1

## 2017-06-13 MED ORDER — PROPOFOL 10 MG/ML IV BOLUS
INTRAVENOUS | Status: AC
  Filled 2017-06-13: qty 40

## 2017-06-13 MED ORDER — MIDAZOLAM HCL 2 MG/2ML IJ SOLN
INTRAMUSCULAR | Status: DC | PRN
  Administered 2017-06-13: 2 mg via INTRAVENOUS

## 2017-06-13 MED ORDER — ONDANSETRON HCL 4 MG/2ML IJ SOLN
INTRAMUSCULAR | Status: AC
  Filled 2017-06-13: qty 2

## 2017-06-13 MED ORDER — IBUPROFEN 600 MG PO TABS: 600.0000 mg | ORAL_TABLET | Freq: Four times a day (QID) | ORAL | 1 refills | Status: DC | PRN

## 2017-06-13 MED ORDER — LIDOCAINE HCL (CARDIAC) 20 MG/ML IV SOLN
INTRAVENOUS | Status: AC
  Filled 2017-06-13: qty 5

## 2017-06-13 MED ORDER — ONDANSETRON HCL 4 MG/2ML IJ SOLN
4.0000 mg | Freq: Once | INTRAMUSCULAR | Status: DC | PRN
Start: 2017-06-13 — End: 2017-06-13

## 2017-06-13 MED ORDER — SODIUM CHLORIDE 0.9 % IR SOLN
Status: DC | PRN
  Administered 2017-06-13: 3000 mL

## 2017-06-13 MED ORDER — LIDOCAINE HCL (CARDIAC) 20 MG/ML IV SOLN
INTRAVENOUS | Status: DC | PRN
  Administered 2017-06-13: 80 mg via INTRAVENOUS

## 2017-06-13 MED ORDER — FENTANYL CITRATE (PF) 100 MCG/2ML IJ SOLN
25.0000 ug | INTRAMUSCULAR | Status: DC | PRN
  Administered 2017-06-13 (×4): 25 ug via INTRAVENOUS

## 2017-06-13 MED ORDER — BUPIVACAINE HCL (PF) 0.5 % IJ SOLN
INTRAMUSCULAR | Status: AC
Start: 2017-06-13 — End: 2017-06-13
  Filled 2017-06-13: qty 30

## 2017-06-13 MED ORDER — MEPERIDINE HCL 25 MG/ML IJ SOLN: 6.2500 mg | INTRAMUSCULAR | Status: DC | PRN

## 2017-06-13 MED ORDER — LACTATED RINGERS IV SOLN
INTRAVENOUS | Status: DC
  Administered 2017-06-13 (×2): via INTRAVENOUS

## 2017-06-13 MED ORDER — FENTANYL CITRATE (PF) 250 MCG/5ML IJ SOLN
INTRAMUSCULAR | Status: DC | PRN
  Administered 2017-06-13: 25 ug via INTRAVENOUS
  Administered 2017-06-13: 100 ug via INTRAVENOUS

## 2017-06-13 MED ORDER — MIDAZOLAM HCL 2 MG/2ML IJ SOLN
INTRAMUSCULAR | Status: AC
  Filled 2017-06-13: qty 2

## 2017-06-13 MED ORDER — GLYCOPYRROLATE 0.2 MG/ML IJ SOLN
INTRAMUSCULAR | Status: DC | PRN
  Administered 2017-06-13: 0.1 mg via INTRAVENOUS

## 2017-06-13 MED ORDER — SCOPOLAMINE 1 MG/3DAYS TD PT72
MEDICATED_PATCH | TRANSDERMAL | Status: AC
  Administered 2017-06-13: 1.5 mg via TRANSDERMAL
  Filled 2017-06-13: qty 1

## 2017-06-13 SURGICAL SUPPLY — 20 items
CANISTER SUCT 3000ML PPV (MISCELLANEOUS) ×2 IMPLANT
CLOTH BEACON ORANGE TIMEOUT ST (SAFETY) ×2 IMPLANT
CONTAINER PREFILL 10% NBF 60ML (FORM) ×4 IMPLANT
DILATOR CANAL MILEX (MISCELLANEOUS) IMPLANT
ELECT REM PT RETURN 9FT ADLT (ELECTROSURGICAL)
ELECTRODE REM PT RTRN 9FT ADLT (ELECTROSURGICAL) IMPLANT
GLOVE BIO SURGEON STRL SZ 6.5 (GLOVE) ×2 IMPLANT
GLOVE BIOGEL PI IND STRL 7.0 (GLOVE) ×1 IMPLANT
GLOVE BIOGEL PI INDICATOR 7.0 (GLOVE) ×1
GOWN STRL REUS W/TWL LRG LVL3 (GOWN DISPOSABLE) ×4 IMPLANT
NDL SPNL 18GX3.5 QUINCKE PK (NEEDLE) ×1 IMPLANT
NEEDLE SPNL 18GX3.5 QUINCKE PK (NEEDLE) ×2 IMPLANT
PACK VAGINAL MINOR WOMEN LF (CUSTOM PROCEDURE TRAY) ×2 IMPLANT
PAD OB MATERNITY 4.3X12.25 (PERSONAL CARE ITEMS) ×2 IMPLANT
PAD PREP 24X48 CUFFED NSTRL (MISCELLANEOUS) ×2 IMPLANT
SET GENESYS HTA PROCERVA (MISCELLANEOUS) ×1 IMPLANT
SUT VIC AB 2-0 SH 27 (SUTURE) ×2
SUT VIC AB 2-0 SH 27XBRD (SUTURE) IMPLANT
SYR 30ML LL (SYRINGE) ×2 IMPLANT
TOWEL OR 17X24 6PK STRL BLUE (TOWEL DISPOSABLE) ×4 IMPLANT

## 2017-06-13 NOTE — Anesthesia Postprocedure Evaluation (Signed)
Anesthesia Post Note  Patient: Katrina Wiley  Procedure(s) Performed: Procedure(s) (LRB): DILATATION & CURETTAGE/HYSTEROSCOPY WITH HYDROTHERMAL ABLATION (N/A)     Patient location during evaluation: PACU Anesthesia Type: General Level of consciousness: awake and alert Pain management: pain level controlled Vital Signs Assessment: post-procedure vital signs reviewed and stable Respiratory status: spontaneous breathing, nonlabored ventilation, respiratory function stable and patient connected to nasal cannula oxygen Cardiovascular status: blood pressure returned to baseline and stable Postop Assessment: no signs of nausea or vomiting Anesthetic complications: no    Last Vitals:  Vitals:   06/13/17 0903 06/13/17 1147  BP: 118/81   Pulse: 84 86  Resp: 16 14  Temp: 37.1 C 36.9 C    Last Pain:  Vitals:   06/13/17 0903  TempSrc: Oral   Pain Goal: Patients Stated Pain Goal: 4 (06/13/17 0903)               [name]

## 2017-06-13 NOTE — Op Note (Signed)
06/13/2017  11:44 AM  PATIENT:  Katrina Wiley  36 y.o. female  PRE-OPERATIVE DIAGNOSIS:  DUB  POST-OPERATIVE DIAGNOSIS:  DUB  PROCEDURE:  Procedure(s): DILATATION & CURETTAGE/HYSTEROSCOPY WITH HYDROTHERMAL ABLATION (N/A)  SURGEON:  Surgeon(s) and Role:    * Jodeci Roarty C, MD - Primary   ANESTHESIA:   general and paracervical block  EBL:  Total I/O In: 1700 [I.V.:1700] Out: 10 [Blood:10]  BLOOD ADMINISTERED:none  DRAINS: none   LOCAL MEDICATIONS USED:  MARCAINE     SPECIMEN:  Source of Specimen:  uterine curettings  DISPOSITION OF SPECIMEN:  PATHOLOGY  COUNTS:  YES  TOURNIQUET:  * No tourniquets in log *  DICTATION: .Dragon Dictation  PLAN OF CARE: Discharge to home after PACU  PATIENT DISPOSITION:  PACU - hemodynamically stable.   Delay start of Pharmacological VTE agent (>24hrs) due to surgical blood loss or risk of bleeding: not applicable    The risks, benefits, and alternatives of surgery were explained, understood, and accepted. All questions were answered. Consents were signed. In the operating room general anesthesia was applied without complication, and she was placed in the dorsal lithotomy position. Her vagina was prepped and draped in the usual sterile fashion. A Robinson catheter was used to drain her bladder. A bimanual exam revealed a NSSR mobile uterus. Her adnexa were nonenlarged. A speculum was placed and a single-tooth tenaculum was used to grasp the posterior lip of her cervix. A total of 30 mL of 0.5% Marcaine was used to perform a paracervical block. Her uterus sounded to 9 cm. Her cervix was carefully and slowly dilated to accommodate a small curette. A curettage was done in all quadrants and the fundus of the uterus. A large amount of polypoid-type  tissue was obtained. I then performed hysteroscopy. No abnormalities were noted. The HTA machine passed its test and ran for 8 of the 10 minutes. The machine then just got off and then powered back  up. Error 25 was read on the screen. The rep was called and she instructed me to manually cool the uterine cavity. I did this and room temperature fluid was noted. I removed the tenaculum from the cervix. I placed a 3-0 vicryl figure of 8 suture to achieve hemostasis from a small tear at that site.  She was taken to the recovery room after being extubated. She tolerated the procedure well.

## 2017-06-13 NOTE — Anesthesia Preprocedure Evaluation (Signed)
Anesthesia Evaluation  Patient identified by MRN, date of birth, ID band Patient awake    Reviewed: Allergy & Precautions, NPO status , Patient's Chart, lab work & pertinent test results  Airway Mallampati: II  TM Distance: >3 FB Neck ROM: Full    Dental no notable dental hx.    Pulmonary neg pulmonary ROS,    Pulmonary exam normal breath sounds clear to auscultation       Cardiovascular negative cardio ROS Normal cardiovascular exam Rhythm:Regular Rate:Normal     Neuro/Psych negative neurological ROS  negative psych ROS   GI/Hepatic negative GI ROS, Neg liver ROS,   Endo/Other  negative endocrine ROS  Renal/GU negative Renal ROS  negative genitourinary   Musculoskeletal negative musculoskeletal ROS (+)   Abdominal   Peds negative pediatric ROS (+)  Hematology negative hematology ROS (+)   Anesthesia Other Findings   Reproductive/Obstetrics negative OB ROS                             Anesthesia Physical Anesthesia Plan  ASA: II  Anesthesia Plan: General   Post-op Pain Management:    Induction: Intravenous  PONV Risk Score and Plan: 2 and Ondansetron, Dexamethasone, Propofol and Treatment may vary due to age or medical condition  Airway Management Planned: LMA  Additional Equipment:   Intra-op Plan:   Post-operative Plan:   Informed Consent:   Plan Discussed with: CRNA and Surgeon  Anesthesia Plan Comments: ( )        Anesthesia Quick Evaluation

## 2017-06-13 NOTE — Anesthesia Procedure Notes (Signed)
Procedure Name: LMA Insertion Date/Time: 06/13/2017 10:45 AM Performed by: Graciela HusbandsFUSSELL, Johnisha Louks O Pre-anesthesia Checklist: Patient identified, Emergency Drugs available, Suction available, Patient being monitored and Timeout performed Patient Re-evaluated:Patient Re-evaluated prior to induction Oxygen Delivery Method: Circle system utilized Preoxygenation: Pre-oxygenation with 100% oxygen Induction Type: IV induction LMA: LMA inserted LMA Size: 4.0 Number of attempts: 1 Placement Confirmation: positive ETCO2 and breath sounds checked- equal and bilateral Tube secured with: Tape Dental Injury: Teeth and Oropharynx as per pre-operative assessment

## 2017-06-13 NOTE — H&P (Signed)
Katrina Wiley is an 36 y.o.  S AA P3 (17, 14, and 35 yo kids) here today to discuss her interest in an ablation. She describes heavy periods, lasting 6 days, and anemia. She takes iron. She has taken OCPs for 1 month in the past in an attempt to lighten her periods but she had BTB. Her hbg in 2/18 was 8.1. She has had a BTL. Her periods are not terribly painful. Her u/s was normal.   Patient's last menstrual period was 05/26/2017 (approximate).    Past Medical History:  Diagnosis Date  . Abnormal vaginal Pap smear    cryo done in Dr Verdell CarmineHarper's office  . Anemia   . Hyperlipidemia    no meds, diet controlled  . Preterm labor   . SVD (spontaneous vaginal delivery)    x 3    Past Surgical History:  Procedure Laterality Date  . DILATION AND CURETTAGE OF UTERUS    . INCISION AND DRAINAGE PERIRECTAL ABSCESS    . TUBAL LIGATION    . WISDOM TOOTH EXTRACTION      History reviewed. No pertinent family history.  Social History:  reports that she has never smoked. She has never used smokeless tobacco. She reports that she drinks alcohol. She reports that she does not use drugs.  Allergies: No Known Allergies  Prescriptions Prior to Admission  Medication Sig Dispense Refill Last Dose  . clindamycin-benzoyl peroxide (BENZACLIN) gel Apply 1 application topically daily as needed (breakouts). Reported on 02/08/2016   Past Month at Unknown time  . ferrous sulfate 325 (65 FE) MG tablet Take 1 tablet (325 mg total) by mouth 2 (two) times daily with a meal. 60 tablet 0 Past Week at Unknown time  . ibuprofen (ADVIL,MOTRIN) 200 MG tablet Take 400 mg by mouth daily as needed for headache or moderate pain.   06/10/2017 at Unknown time  . Iron-FA-B Cmp-C-Biot-Probiotic (FUSION PLUS) CAPS Take 1 capsule by mouth daily with lunch.   Past Week at Unknown time  . Norethin Ace-Eth Estrad-FE (TAYTULLA) 1-20 MG-MCG(24) CAPS Take 1 capsule by mouth daily. (Patient not taking: Reported on 05/07/2017) 28 capsule  11 Not Taking at Unknown time  . Prenatal-DSS-FeCb-FeGl-FA (CITRANATAL BLOOM) 90-1 MG TABS Take 1 tablet by mouth daily before breakfast. 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   Blood pressure 118/81, pulse 84, temperature 98.7 F (37.1 C), temperature source Oral, resp. rate 16, last menstrual period 05/26/2017, SpO2 100 %, unknown if currently breastfeeding. Physical Exam  Heart- rrr Lungs- CTAB Abd- benign  Results for orders placed or performed during the hospital encounter of 06/13/17 (from the past 24 hour(s))  Pregnancy, urine     Status: None   Collection Time: 06/13/17  9:00 AM  Result Value Ref Range   Preg Test, Ur NEGATIVE NEGATIVE    No results found.  Assessment/Plan: menorrhagia with anemia. Plan for d&c, endometrial abation.  She understands the risks of surgery, including, but not to infection, bleeding, DVTs, damage to bowel, bladder, ureters. She wishes to proceed.      Walterine Amodei C Laura Radilla 06/13/2017, 10:03 AM

## 2017-06-13 NOTE — OR Nursing (Signed)
8 minutes, 30 seconds of ablation time. Manual cooling with NACL.

## 2017-06-13 NOTE — Discharge Instructions (Signed)
°Post Anesthesia Home Care Instructions ° °Activity: °Get plenty of rest for the remainder of the day. A responsible individual must stay with you for 24 hours following the procedure.  °For the next 24 hours, DO NOT: °-Drive a car °-Operate machinery °-Drink alcoholic beverages °-Take any medication unless instructed by your physician °-Make any legal decisions or sign important papers. ° °Meals: °Start with liquid foods such as gelatin or soup. Progress to regular foods as tolerated. Avoid greasy, spicy, heavy foods. If nausea and/or vomiting occur, drink only clear liquids until the nausea and/or vomiting subsides. Call your physician if vomiting continues. ° °Special Instructions/Symptoms: °Your throat may feel dry or sore from the anesthesia or the breathing tube placed in your throat during surgery. If this causes discomfort, gargle with warm salt water. The discomfort should disappear within 24 hours. ° °If you had a scopolamine patch placed behind your ear for the management of post- operative nausea and/or vomiting: ° °1. The medication in the patch is effective for 72 hours, after which it should be removed.  Wrap patch in a tissue and discard in the trash. Wash hands thoroughly with soap and water. °2. You may remove the patch earlier than 72 hours if you experience unpleasant side effects which may include dry mouth, dizziness or visual disturbances. °3. Avoid touching the patch. Wash your hands with soap and water after contact with the patch. °  °Dilation and Curettage or Vacuum Curettage, Care After °This sheet gives you information about how to care for yourself after your procedure. Your health care provider may also give you more specific instructions. If you have problems or questions, contact your health care provider. °What can I expect after the procedure? °After your procedure, it is common to have: °· Mild pain or cramping. °· Some vaginal bleeding or spotting. ° °These may last for up to 2  weeks after your procedure. °Follow these instructions at home: °Activity ° °· Do not drive or use heavy machinery while taking prescription pain medicine. °· Avoid driving for the first 24 hours after your procedure. °· Take frequent, short walks, followed by rest periods, throughout the day. Ask your health care provider what activities are safe for you. After 1-2 days, you may be able to return to your normal activities. °· Do not lift anything heavier than 10 lb (4.5 kg) until your health care provider approves. °· For at least 2 weeks, or as long as told by your health care provider, do not: °? Douche. °? Use tampons. °? Have sexual intercourse. °General instructions ° °· Take over-the-counter and prescription medicines only as told by your health care provider. This is especially important if you take blood thinning medicine. °· Do not take baths, swim, or use a hot tub until your health care provider approves. Take showers instead of baths. °· Wear compression stockings as told by your health care provider. These stockings help to prevent blood clots and reduce swelling in your legs. °· It is your responsibility to get the results of your procedure. Ask your health care provider, or the department performing the procedure, when your results will be ready. °· Keep all follow-up visits as told by your health care provider. This is important. °Contact a health care provider if: °· You have severe cramps that get worse or that do not get better with medicine. °· You have severe abdominal pain. °· You cannot drink fluids without vomiting. °· You develop pain in a different area of your   pelvis.  You have bad-smelling vaginal discharge.  You have a rash. Get help right away if:  You have vaginal bleeding that soaks more than one sanitary pad in 1 hour, for 2 hours in a row.  You pass large blood clots from your vagina.  You have a fever that is above 100.19F (38.0C).  Your abdomen feels very tender or  hard.  You have chest pain.  You have shortness of breath.  You cough up blood.  You feel dizzy or light-headed.  You faint.  You have pain in your neck or shoulder area. This information is not intended to replace advice given to you by your health care provider. Make sure you discuss any questions you have with your health care provider. Document Released: 11/09/2000 Document Revised: 07/11/2016 Document Reviewed: 06/14/2016 Elsevier Interactive Patient Education  2018 [employer] Endometrial Ablation Endometrial ablation is a procedure that destroys the thin inner layer of the lining of the uterus (endometrium). This procedure may be done:  To stop heavy periods.  To stop bleeding that is causing anemia.  To control irregular bleeding.  To treat bleeding caused by small tumors (fibroids) in the endometrium.  This procedure is often an alternative to major surgery, such as removal of the uterus and cervix (hysterectomy). As a result of this procedure:  You may not be able to have children. However, if you are premenopausal (you have not gone through menopause): ? You may still have a small chance of getting pregnant. ? You will need to use a reliable method of birth control after the procedure to prevent pregnancy.  You may stop having a menstrual period, or you may have only a small amount of bleeding during your period. Menstruation may return several years after the procedure.  Tell a health care provider about:  Any allergies you have.  All medicines you are taking, including vitamins, herbs, eye drops, creams, and over-the-counter medicines.  Any problems you or family members have had with the use of anesthetic medicines.  Any blood disorders you have.  Any surgeries you have had.  Any medical conditions you have. What are the risks? Generally, this is a safe procedure. However, problems may occur, including:  A hole (perforation) in the uterus or  bowel.  Infection of the uterus, bladder, or vagina.  Bleeding.  Damage to other structures or organs.  An air bubble in the lung (air embolus).  Problems with pregnancy after the procedure.  Failure of the procedure.  Decreased ability to diagnose cancer in the endometrium.  What happens before the procedure?  You will have tests of your endometrium to make sure there are no pre-cancerous cells or cancer cells present.  You may have an ultrasound of the uterus.  You may be given medicines to thin the endometrium.  Ask your health care provider about: ? Changing or stopping your regular medicines. This is especially important if you take diabetes medicines or blood thinners. ? Taking medicines such as aspirin and ibuprofen. These medicines can thin your blood. Do not take these medicines before your procedure if your doctor tells you not to.  Plan to have someone take you home from the hospital or clinic. What happens during the procedure?  You will lie on an exam table with your feet and legs supported as in a pelvic exam.  To lower your risk of infection: ? Your health care team will wash or sanitize their hands and put on germ-free (sterile) gloves. ? Your  genital area will be washed with soap.  An IV tube will be inserted into one of your veins.  You will be given a medicine to help you relax (sedative).  A surgical instrument with a light and camera (resectoscope) will be inserted into your vagina and moved into your uterus. This allows your surgeon to see inside your uterus.  Endometrial tissue will be removed using one of the following methods: ? Radiofrequency. This method uses a radiofrequency-alternating electric current to remove the endometrium. ? Cryotherapy. This method uses extreme cold to freeze the endometrium. ? Heated-free liquid. This method uses a heated saltwater (saline) solution to remove the endometrium. ? Microwave. This method uses high-energy  microwaves to heat up the endometrium and remove it. ? Thermal balloon. This method involves inserting a catheter with a balloon tip into the uterus. The balloon tip is filled with heated fluid to remove the endometrium. The procedure may vary among health care providers and hospitals. What happens after the procedure?  Your blood pressure, heart rate, breathing rate, and blood oxygen level will be monitored until the medicines you were given have worn off.  As tissue healing occurs, you may notice vaginal bleeding for 4-6 weeks after the procedure. You may also experience: ? Cramps. ? Thin, watery vaginal discharge that is light pink or brown in color. ? A need to urinate more frequently than usual. ? Nausea.  Do not drive for 24 hours if you were given a sedative.  Do not have sex or insert anything into your vagina until your health care provider approves. Summary  Endometrial ablation is done to treat the many causes of heavy menstrual bleeding.  The procedure may be done only after medications have been tried to control the bleeding.  Plan to have someone take you home from the hospital or clinic. This information is not intended to replace advice given to you by your health care provider. Make sure you discuss any questions you have with your health care provider. Document Released: 09/21/2004 Document Revised: 11/29/2016 Document Reviewed: 11/29/2016 Elsevier Interactive Patient Education  2017 [employer].

## 2017-06-13 NOTE — Transfer of Care (Signed)
Immediate Anesthesia Transfer of Care Note  Patient: Katrina Wiley  Procedure(s) Performed: Procedure(s): DILATATION & CURETTAGE/HYSTEROSCOPY WITH HYDROTHERMAL ABLATION (N/A)  Patient Location: PACU  Anesthesia Type:General  Level of Consciousness: awake, alert  and oriented  Airway & Oxygen Therapy: Patient Spontanous Breathing and Patient connected to nasal cannula oxygen  Post-op Assessment: Report given to RN and Post -op Vital signs reviewed and stable  Post vital signs: Reviewed and stable  Last Vitals:  Vitals:   06/13/17 0903  BP: 118/81  Pulse: 84  Resp: 16  Temp: 37.1 C    Last Pain:  Vitals:   06/13/17 0903  TempSrc: Oral      Patients Stated Pain Goal: 4 (06/13/17 0903)  Complications: No apparent anesthesia complications

## 2017-06-14 ENCOUNTER — Encounter (HOSPITAL_COMMUNITY): Payer: Self-pay | Admitting: Obstetrics & Gynecology

## 2017-06-20 ENCOUNTER — Telehealth: Payer: Self-pay

## 2017-06-20 NOTE — Telephone Encounter (Signed)
Returned call, left vm.

## 2017-06-21 ENCOUNTER — Telehealth: Payer: Self-pay

## 2017-06-21 ENCOUNTER — Other Ambulatory Visit: Payer: Self-pay | Admitting: Obstetrics

## 2017-06-21 NOTE — Telephone Encounter (Signed)
Returned call and pt stated that she is having a discharge and odor after ablasion done on 06-13-17. Notified provider and advised pt that it should clear up in a few days, if experiencing pain or fever, notify us, pt agreed.

## 2017-07-14 IMAGING — US US TRANSVAGINAL NON-OB
1 series · 15 of 25 positions shown · non-contrast
Comparison: None

CLINICAL DATA: Menorrhagia with regular cycles

EXAM:
TRANSABDOMINAL AND TRANSVAGINAL ULTRASOUND OF PELVIS
TECHNIQUE: Both transabdominal and transvaginal ultrasound examinations of the
pelvis were performed. Transabdominal technique was performed for
global imaging of the pelvis including uterus, ovaries, adnexal
regions, and pelvic cul-de-sac. It was necessary to proceed with
endovaginal exam following the transabdominal exam to visualize the
uterus, endometrium, ovaries and adnexa .

[Series 1: us transvaginal non-ob · 15 of 56 slices shown]
[im 1/56]
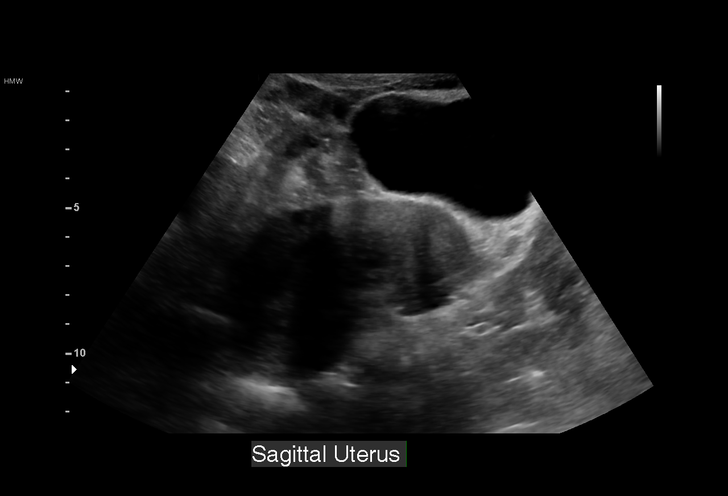
[im 5/56]
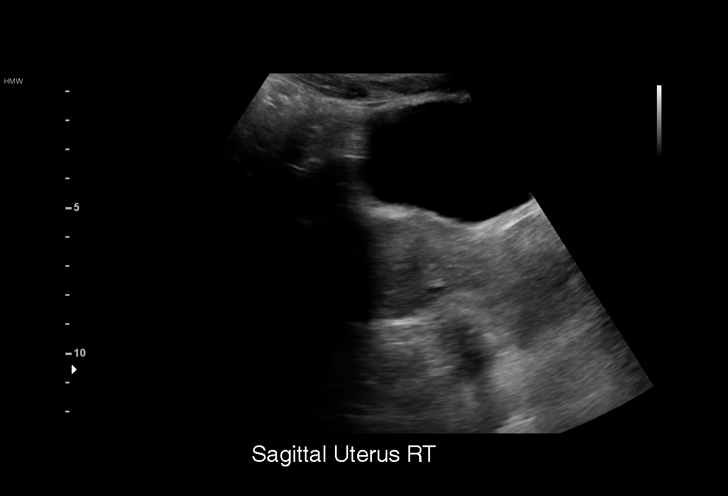
[im 10/56]
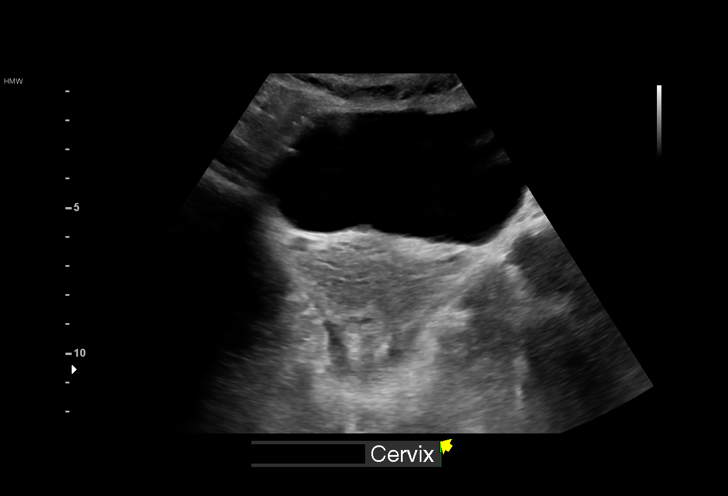
[im 12/56]
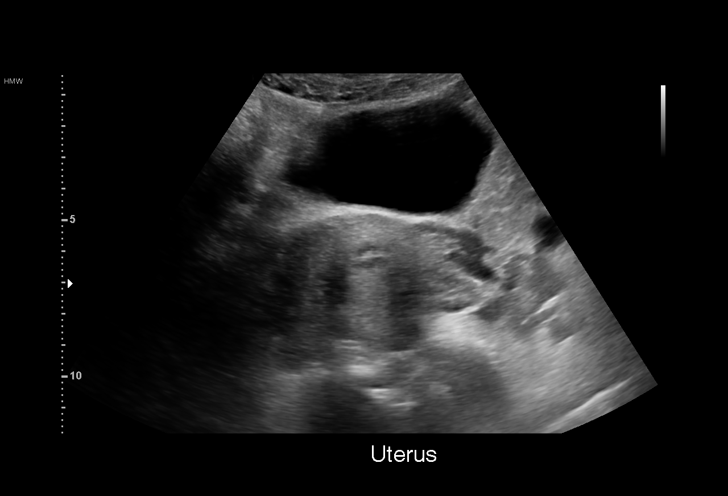
[im 17/56]
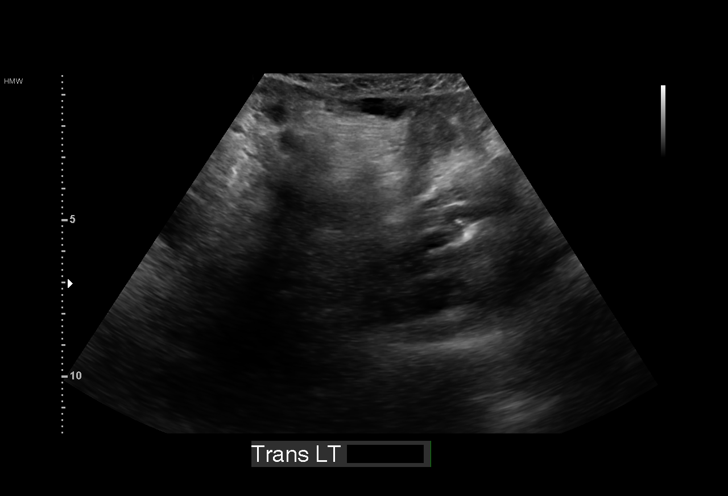
[im 21/56]
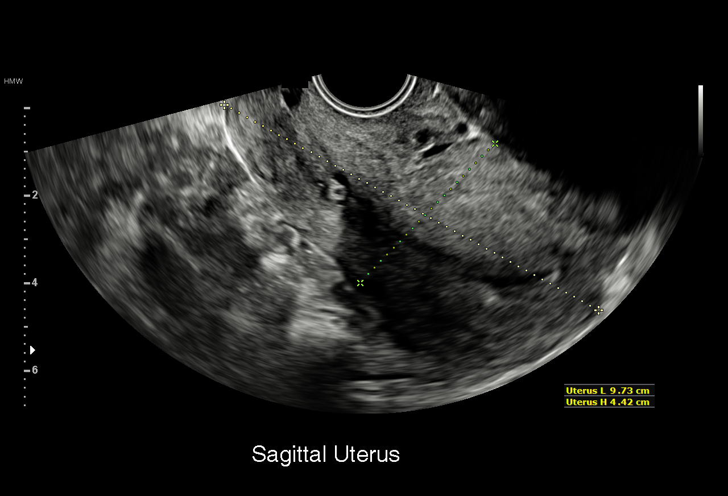
[im 23/56]
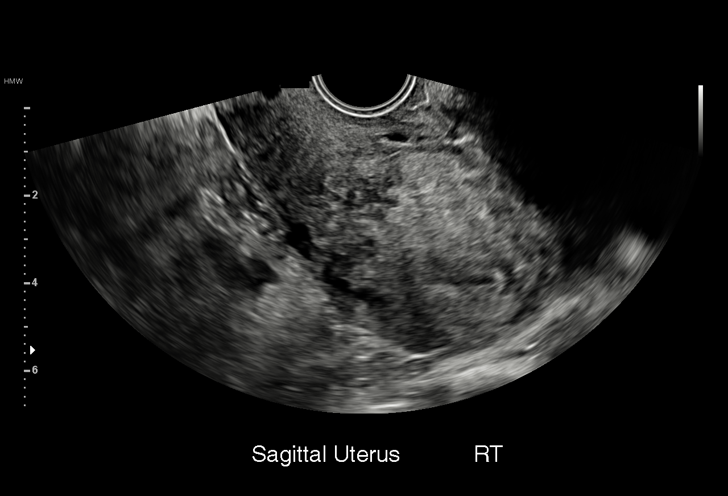
[im 28/56]
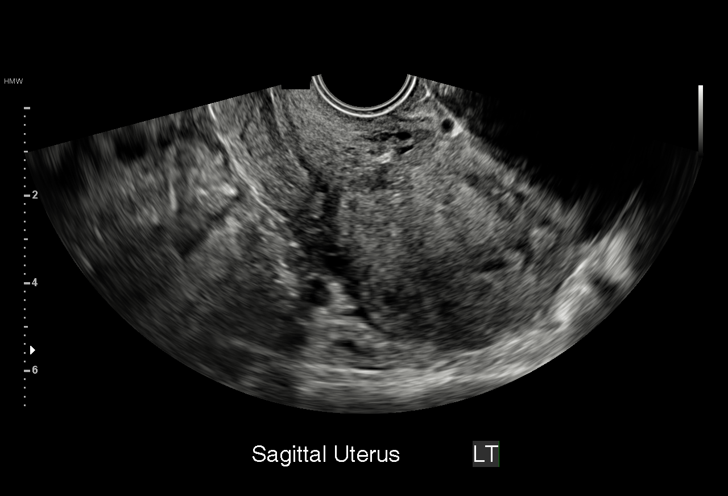
[im 33/56]
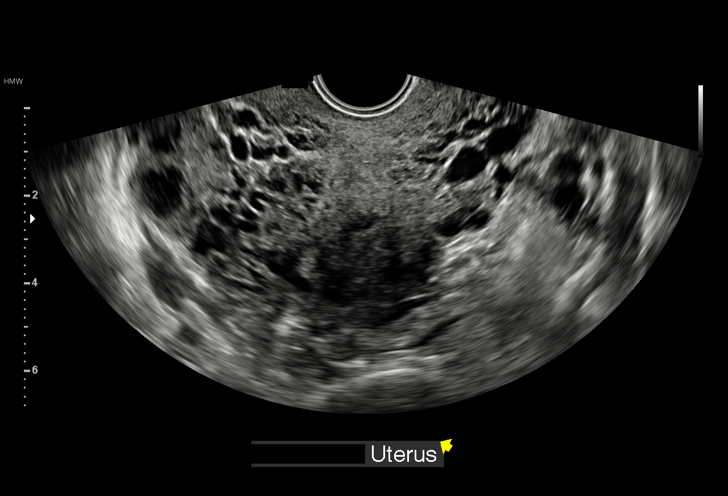
[im 35/56]
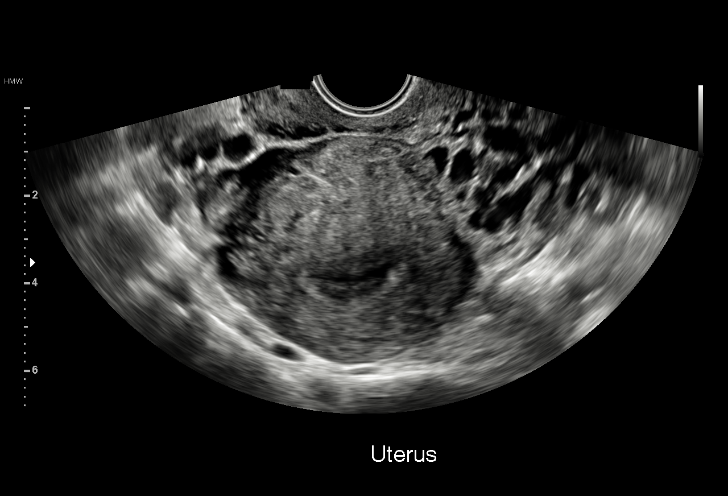
[im 39/56]
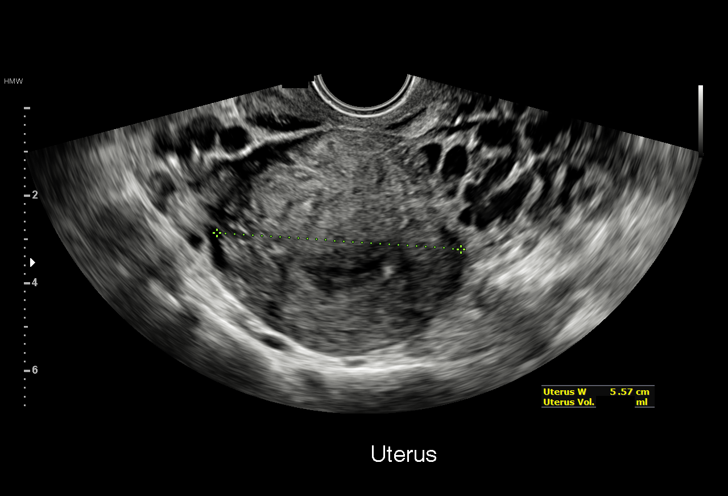
[im 44/56]
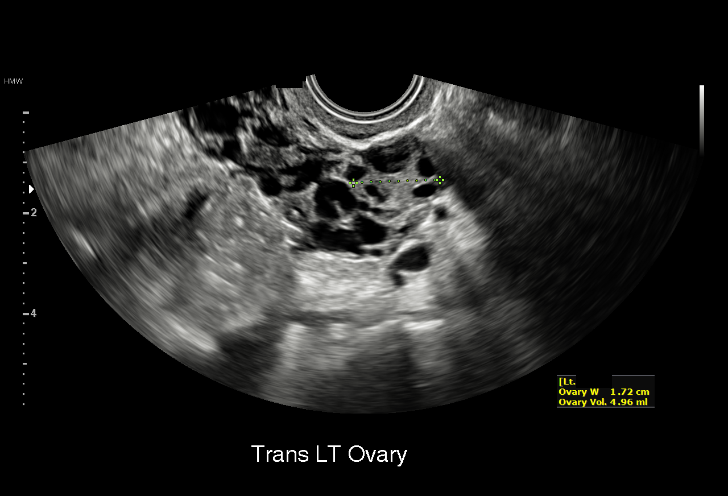
[im 46/56]
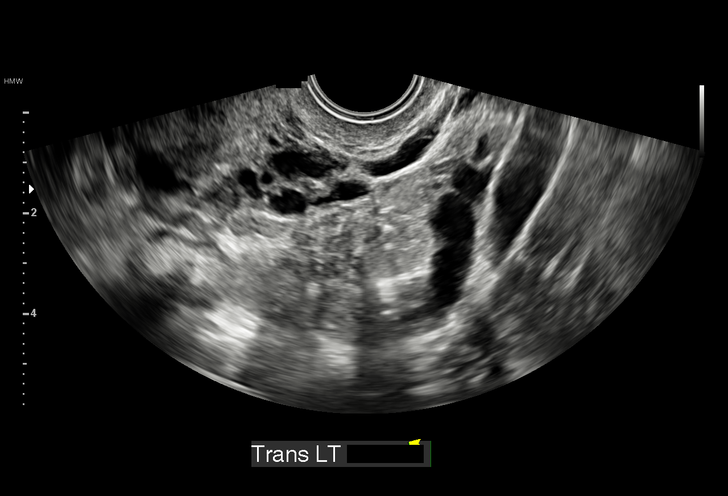
[im 51/56]
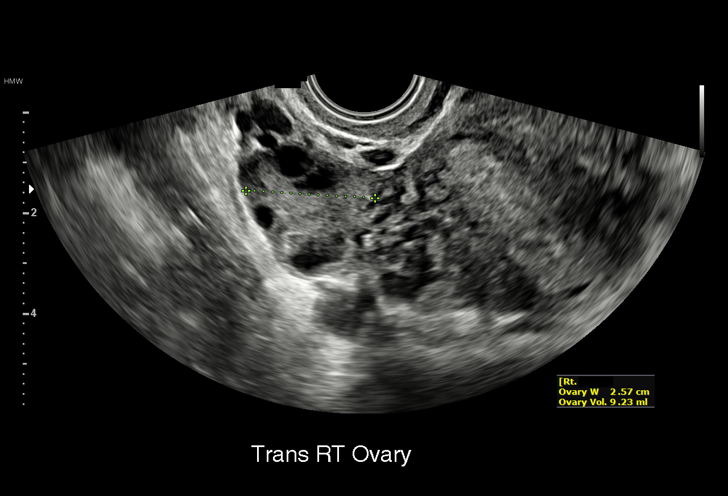
[im 56/56]
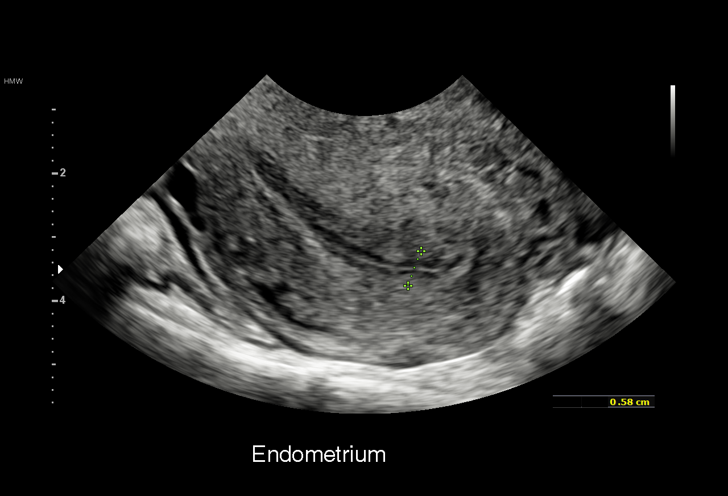

[15 of 25 positions shown; findings below may reference images not displayed]

FINDINGS: Uterus

Measurements: 9.7 x 4.4 x 5.6 cm. No fibroids or other mass
visualized.

Endometrium

Thickness: 6 mm in thickness.  No focal abnormality visualized.

Right ovary

Measurements: 2.8 x 2.5 x 2.6 cm. Normal appearance/no adnexal mass.

Left ovary

Measurements: 2.5 x 2.2 x 1.7 cm. Normal appearance/no adnexal mass.

Other findings

No abnormal free fluid.
IMPRESSION: Normal pelvic ultrasound.

## 2017-07-25 ENCOUNTER — Ambulatory Visit (INDEPENDENT_AMBULATORY_CARE_PROVIDER_SITE_OTHER): Payer: Medicaid Other | Admitting: Obstetrics

## 2017-07-25 ENCOUNTER — Other Ambulatory Visit (HOSPITAL_COMMUNITY)
Admission: RE | Admit: 2017-07-25 | Discharge: 2017-07-25 | Disposition: A | Discharge status: Home / Self Care | Payer: Medicaid Other | Source: Ambulatory Visit | Attending: Obstetrics | Admitting: Obstetrics

## 2017-07-25 ENCOUNTER — Encounter: Payer: Self-pay | Admitting: Obstetrics

## 2017-07-25 VITALS — BP 118/70 | HR 94 | Wt 123.0 lb

## 2017-07-25 DIAGNOSIS — B9689 Other specified bacterial agents as the cause of diseases classified elsewhere: Secondary | ICD-10-CM | POA: Diagnosis not present

## 2017-07-25 DIAGNOSIS — N898 Other specified noninflammatory disorders of vagina: Secondary | ICD-10-CM | POA: Insufficient documentation

## 2017-07-25 DIAGNOSIS — N760 Acute vaginitis: Secondary | ICD-10-CM

## 2017-07-25 DIAGNOSIS — D500 Iron deficiency anemia secondary to blood loss (chronic): Secondary | ICD-10-CM

## 2017-07-25 DIAGNOSIS — N939 Abnormal uterine and vaginal bleeding, unspecified: Secondary | ICD-10-CM | POA: Diagnosis not present

## 2017-07-25 DIAGNOSIS — Z9889 Other specified postprocedural states: Secondary | ICD-10-CM

## 2017-07-25 MED ORDER — FERRALET 90 90-1 MG PO TABS: 1.0000 | ORAL_TABLET | Freq: Every day | ORAL | 11 refills | Status: DC

## 2017-07-25 MED ORDER — METRONIDAZOLE 500 MG PO TABS: 500.0000 mg | ORAL_TABLET | Freq: Two times a day (BID) | ORAL | 2 refills | Status: DC

## 2017-07-25 NOTE — Progress Notes (Signed)
Post Op Follow up. Complains of foul odor sometimes.  LMP 07/06/17 6 days.

## 2017-07-25 NOTE — Progress Notes (Signed)
Patient ID: Katrina Wiley, female   DOB: 18-May-1981, 36 y.o.   MRN: [id number]  Chief Complaint  Patient presents with  . Post-op Follow-up    HPI Katrina Wiley is a 36 y.o. female.  History of AUB.  S/P Endometrial Ablation.  Presents for 6 week post op check up.  Complains of malodorous discharge. HPI  Past Medical History:  Diagnosis Date  . Abnormal vaginal Pap smear    cryo done in Dr Verdell CarmineHarper's office  . Anemia   . Hyperlipidemia    no meds, diet controlled  . Preterm labor   . SVD (spontaneous vaginal delivery)    x 3    Past Surgical History:  Procedure Laterality Date  . DILATION AND CURETTAGE OF UTERUS    . DILITATION & CURRETTAGE/HYSTROSCOPY WITH HYDROTHERMAL ABLATION N/A 06/13/2017   Procedure: DILATATION & CURETTAGE/HYSTEROSCOPY WITH ATTEMPTED HYDROTHERMAL ABLATION;  Surgeon: Allie Bossierove, Myra C, MD;  Location: [facility];  Service: Gynecology;  Laterality: N/A;  . INCISION AND DRAINAGE PERIRECTAL ABSCESS    . TUBAL LIGATION    . WISDOM TOOTH EXTRACTION      History reviewed. No pertinent family history.  Social History Social History  Substance Use Topics  . Smoking status: Never Smoker  . Smokeless tobacco: Never Used  . Alcohol use 0.0 oz/week     Comment: occasional    No Known Allergies  Current Outpatient Prescriptions  Medication Sig Dispense Refill  . clindamycin-benzoyl peroxide (BENZACLIN) gel Apply 1 application topically daily as needed (breakouts). Reported on 02/08/2016    . Fe Cbn-Fe Gluc-FA-B12-C-DSS (FERRALET 90) 90-1 MG TABS Take 1 tablet by mouth daily before breakfast. 30 each 11  . ibuprofen (ADVIL,MOTRIN) 600 MG tablet Take 1 tablet (600 mg total) by mouth every 6 (six) hours as needed. 30 tablet 1  . metroNIDAZOLE (FLAGYL) 500 MG tablet Take 1 tablet (500 mg total) by mouth 2 (two) times daily. 14 tablet 2  . oxyCODONE-acetaminophen (PERCOCET/ROXICET) 5-325 MG tablet Take 1-2 tablets by mouth every 6 (six) hours as needed. (Patient not  taking: Reported on 07/25/2017) 15 tablet 0   No current facility-administered medications for this visit.     Review of Systems Review of Systems Constitutional: negative for fatigue and weight loss Respiratory: negative for cough and wheezing Cardiovascular: negative for chest pain, fatigue and palpitations Gastrointestinal: negative for abdominal pain and change in bowel habits Genitourinary:positive for malodorous vaginal discharge Integument/breast: negative for nipple discharge Musculoskeletal:negative for myalgias Neurological: negative for gait problems and tremors Behavioral/Psych: negative for abusive relationship, depression Endocrine: negative for temperature intolerance      Blood pressure 118/70, pulse 94, weight 123 lb (55.8 kg), last menstrual period 07/06/2017, not currently breastfeeding.  Physical Exam Physical Exam           General:  Alert and no distress Abdomen:  normal findings: no organomegaly, soft, non-tender and no hernia  Pelvis:  External genitalia: normal general appearance Urinary system: urethral meatus normal and bladder without fullness, nontender Vaginal: normal without tenderness, induration or masses Cervix: normal appearance Adnexa: normal bimanual exam Uterus: anteverted and non-tender, normal size    50% of 15 min visit spent on counseling and coordination of care.    Data Reviewed CBC  Assessment     1. Abnormal uterine bleeding (AUB)   2. S/P endometrial ablation - doing well  3. Iron deficiency anemia due to chronic blood loss Rx: - Fe Cbn-Fe Gluc-FA-B12-C-DSS (FERRALET 90) 90-1 MG TABS; Take 1 tablet by  mouth daily before breakfast.  Dispense: 30 each; Refill: 11  4. Vaginal discharge Rx: - Cervicovaginal ancillary only  5. BV (bacterial vaginosis) Rx: - metroNIDAZOLE (FLAGYL) 500 MG tablet; Take 1 tablet (500 mg total) by mouth 2 (two) times daily.  Dispense: 14 tablet; Refill: 2    Plan    Follow up in ~ 8 months  for Annual  No orders of the defined types were placed in this encounter.  Meds ordered this encounter  Medications  . Fe Cbn-Fe Gluc-FA-B12-C-DSS (FERRALET 90) 90-1 MG TABS    Sig: Take 1 tablet by mouth daily before breakfast.    Dispense:  30 each    Refill:  11  . metroNIDAZOLE (FLAGYL) 500 MG tablet    Sig: Take 1 tablet (500 mg total) by mouth 2 (two) times daily.    Dispense:  14 tablet    Refill:  2

## 2017-08-01 ENCOUNTER — Other Ambulatory Visit: Payer: Self-pay | Admitting: Obstetrics

## 2017-08-01 LAB — CERVICOVAGINAL ANCILLARY ONLY
Bacterial vaginitis: POSITIVE — AB
CHLAMYDIA, DNA PROBE: NEGATIVE
Candida vaginitis: NEGATIVE
Neisseria Gonorrhea: NEGATIVE
Trichomonas: NEGATIVE

## 2017-09-30 ENCOUNTER — Telehealth: Payer: Self-pay | Admitting: *Deleted

## 2017-09-30 ENCOUNTER — Other Ambulatory Visit: Payer: Self-pay | Admitting: Obstetrics

## 2017-09-30 DIAGNOSIS — B373 Candidiasis of vulva and vagina: Secondary | ICD-10-CM

## 2017-09-30 DIAGNOSIS — Z3041 Encounter for surveillance of contraceptive pills: Secondary | ICD-10-CM

## 2017-09-30 DIAGNOSIS — B3731 Acute candidiasis of vulva and vagina: Secondary | ICD-10-CM

## 2017-09-30 MED ORDER — LO LOESTRIN FE 1 MG-10 MCG / 10 MCG PO TABS: 1.0000 | ORAL_TABLET | Freq: Every day | ORAL | 4 refills | Status: DC

## 2017-09-30 MED ORDER — FLUCONAZOLE 150 MG PO TABS: 150.0000 mg | ORAL_TABLET | Freq: Once | ORAL | 0 refills | Status: AC

## 2017-09-30 NOTE — Telephone Encounter (Signed)
Pt called to office asking for Diflucan Rx and she would also like to restart her BC. Pt states she was previously on LoLoestrin 24. [facility] [address]    Please send Rx's if approved.

## 2017-09-30 NOTE — Telephone Encounter (Signed)
Lo Loestrin Fe Rx Diflucan Rx

## 2017-10-28 ENCOUNTER — Ambulatory Visit: Payer: Self-pay | Admitting: Obstetrics

## 2017-11-10 ENCOUNTER — Emergency Department (HOSPITAL_BASED_OUTPATIENT_CLINIC_OR_DEPARTMENT_OTHER): Payer: BLUE CROSS/BLUE SHIELD

## 2017-11-10 ENCOUNTER — Emergency Department (HOSPITAL_BASED_OUTPATIENT_CLINIC_OR_DEPARTMENT_OTHER)
Admission: EM | Admit: 2017-11-10 | Discharge: 2017-11-10 | Disposition: A | Discharge status: Home / Self Care | Payer: BLUE CROSS/BLUE SHIELD | Attending: Emergency Medicine | Admitting: Emergency Medicine

## 2017-11-10 ENCOUNTER — Other Ambulatory Visit: Payer: Self-pay

## 2017-11-10 ENCOUNTER — Encounter (HOSPITAL_BASED_OUTPATIENT_CLINIC_OR_DEPARTMENT_OTHER): Payer: Self-pay | Admitting: *Deleted

## 2017-11-10 DIAGNOSIS — Z791 Long term (current) use of non-steroidal anti-inflammatories (NSAID): Secondary | ICD-10-CM | POA: Diagnosis not present

## 2017-11-10 DIAGNOSIS — E785 Hyperlipidemia, unspecified: Secondary | ICD-10-CM | POA: Insufficient documentation

## 2017-11-10 DIAGNOSIS — D649 Anemia, unspecified: Secondary | ICD-10-CM | POA: Insufficient documentation

## 2017-11-10 DIAGNOSIS — R0789 Other chest pain: Secondary | ICD-10-CM

## 2017-11-10 DIAGNOSIS — R079 Chest pain, unspecified: Secondary | ICD-10-CM | POA: Diagnosis present

## 2017-11-10 DIAGNOSIS — Z79899 Other long term (current) drug therapy: Secondary | ICD-10-CM | POA: Insufficient documentation

## 2017-11-10 LAB — BASIC METABOLIC PANEL
Anion gap: 7 (ref 5–15)
BUN: 9 mg/dL (ref 6–20)
CALCIUM: 9.3 mg/dL (ref 8.9–10.3)
CO2: 26 mmol/L (ref 22–32)
CREATININE: 0.67 mg/dL (ref 0.44–1.00)
Chloride: 104 mmol/L (ref 101–111)
GFR calc non Af Amer: >60 mL/min (ref >60)
GLUCOSE: 82 mg/dL (ref 65–99)
Potassium: 3.6 mmol/L (ref 3.5–5.1)
Sodium: 137 mmol/L (ref 135–145)

## 2017-11-10 LAB — PREGNANCY, URINE: Preg Test, Ur: NEGATIVE

## 2017-11-10 LAB — CBC WITH DIFFERENTIAL/PLATELET
BASOS ABS: 0 10*3/uL (ref 0.0–0.1)
Basophils Relative: 0 %
Eosinophils Absolute: 0 10*3/uL (ref 0.0–0.7)
Eosinophils Relative: 1 %
HEMATOCRIT: 32.7 % — AB (ref 36.0–46.0)
Hemoglobin: 10.3 g/dL — ABNORMAL LOW (ref 12.0–15.0)
LYMPHS ABS: 1.2 10*3/uL (ref 0.7–4.0)
Lymphocytes Relative: 26 %
MCH: 21.4 pg — ABNORMAL LOW (ref 26.0–34.0)
MCHC: 31.5 g/dL (ref 30.0–36.0)
MCV: 67.8 fL — AB (ref 78.0–100.0)
MONOS PCT: 8 %
Monocytes Absolute: 0.4 10*3/uL (ref 0.1–1.0)
Neutro Abs: 3.1 10*3/uL (ref 1.7–7.7)
Neutrophils Relative %: 65 %
Platelets: 340 10*3/uL (ref 150–400)
RBC: 4.82 MIL/uL (ref 3.87–5.11)
RDW: 18.5 % — AB (ref 11.5–15.5)
WBC: 4.7 10*3/uL (ref 4.0–10.5)

## 2017-11-10 LAB — TROPONIN I: Troponin I: <0.03 ng/mL (ref <0.03)

## 2017-11-10 LAB — D-DIMER, QUANTITATIVE: D-Dimer, Quant: 0.29 ug/mL-FEU (ref 0.00–0.50)

## 2017-11-10 MED ORDER — NAPROXEN 500 MG PO TABS: 500.0000 mg | ORAL_TABLET | Freq: Two times a day (BID) | ORAL | 0 refills | Status: DC

## 2017-11-10 NOTE — ED Notes (Signed)
Pt given urine specimen cup.

## 2017-11-10 NOTE — Discharge Instructions (Signed)
Start naprosyn as prescribed for pain and inflammation. Avoid strenuous activity. Follow up with family doctor as needed. Return if any worsening symptoms.

## 2017-11-10 NOTE — ED Notes (Signed)
Patient transported to X-ray 

## 2017-11-10 NOTE — ED Provider Notes (Signed)
[facility] EMERGENCY DEPARTMENT Provider Note   CSN: [id number] Arrival date & time: 11/10/17  1301     History   Chief Complaint Chief Complaint  Patient presents with  . Chest Pain    HPI Katrina Wiley is a 36 y.o. female.  HPI Katrina Wiley is a 36 y.o. female with history of anemia and hyperlipidemia, presents to emergency department complaining of a chest pain.  Patient states that she developed chest pain yesterday that is in the center of the chest, but it radiates into the left side into the left shoulder.  She states it also shoots into the back.  Pain comes and goes.  She is unsure of anything that causes the pain to get worse.  She denies pain with exertion.  She reports associated shortness of breath.  She states she also has been sick with mild cold symptoms, and reports associated cough.  She has not tried taking any medications for her pain.  She does report history of pneumonia. she also reports recent travel to and from [place], just got back a few days ago.  She denies any pain or swelling in her legs.  She is not currently on any birth control.  No history of blood clots.  Denies any family history of cardiac problems.  Past Medical History:  Diagnosis Date  . Abnormal vaginal Pap smear    cryo done in Dr Verdell CarmineHarper's office  . Anemia   . Hyperlipidemia    no meds, diet controlled  . Preterm labor   . SVD (spontaneous vaginal delivery)    x 3    There are no active problems to display for this patient.   Past Surgical History:  Procedure Laterality Date  . DILATION AND CURETTAGE OF UTERUS    . DILITATION & CURRETTAGE/HYSTROSCOPY WITH HYDROTHERMAL ABLATION N/A 06/13/2017   Procedure: DILATATION & CURETTAGE/HYSTEROSCOPY WITH ATTEMPTED HYDROTHERMAL ABLATION;  Surgeon: Allie Bossierove, Myra C, MD;  Location: [facility];  Service: Gynecology;  Laterality: N/A;  . INCISION AND DRAINAGE PERIRECTAL ABSCESS    . TUBAL LIGATION    . WISDOM TOOTH EXTRACTION       OB History    Gravida Para Term Preterm AB Living   4 2 1 1 1 2    SAB TAB Ectopic Multiple Live Births     1             Home Medications    Prior to Admission medications   Medication Sig Start Date End Date Taking? Authorizing Provider  ibuprofen (ADVIL,MOTRIN) 600 MG tablet Take 1 tablet (600 mg total) by mouth every 6 (six) hours as needed. 06/13/17  Yes Dove, Myra C, MD  clindamycin-benzoyl peroxide (BENZACLIN) gel Apply 1 application topically daily as needed (breakouts). Reported on 02/08/2016    [provider]  Fe Cbn-Fe Gluc-FA-B12-C-DSS (FERRALET 90) 90-1 MG TABS Take 1 tablet by mouth daily before breakfast. 07/25/17   Brock BadHarper, Charles A, MD  LO LOESTRIN FE 1 MG-10 MCG / 10 MCG tablet Take 1 tablet daily by mouth. 09/30/17   Brock BadHarper, Charles A, MD  metroNIDAZOLE (FLAGYL) 500 MG tablet Take 1 tablet (500 mg total) by mouth 2 (two) times daily. 07/25/17   Brock BadHarper, Charles A, MD  oxyCODONE-acetaminophen (PERCOCET/ROXICET) 5-325 MG tablet Take 1-2 tablets by mouth every 6 (six) hours as needed. Patient not taking: Reported on 07/25/2017 06/13/17   Allie Bossierove, Myra C, MD    Family History No family history on file.  Social History Social  History   Tobacco Use  . Smoking status: Never Smoker  . Smokeless tobacco: Never Used  Substance Use Topics  . Alcohol use: Yes    Alcohol/week: 0.0 oz    Comment: occasional  . Drug use: No     Allergies   Patient has no known allergies.   Review of Systems Review of Systems  Constitutional: Negative for chills and fever.  Respiratory: Positive for chest tightness. Negative for cough and shortness of breath.   Cardiovascular: Positive for chest pain. Negative for palpitations and leg swelling.  Gastrointestinal: Negative for abdominal pain, diarrhea, nausea and vomiting.  Genitourinary: Negative for dysuria and flank pain.  Musculoskeletal: Negative for arthralgias, myalgias, neck pain and neck stiffness.  Skin: Negative for  rash.  Neurological: Negative for dizziness, weakness and headaches.  All other systems reviewed and are negative.    Physical Exam Updated Vital Signs BP 119/83 (BP Location: Left Arm)   Pulse 88   Temp 99.1 F (37.3 C)   Resp 16   LMP 10/30/2017   SpO2 100%   Physical Exam  Constitutional: She appears well-developed and well-nourished. No distress.  HENT:  Head: Normocephalic.  Eyes: Conjunctivae are normal.  Neck: Neck supple.  Cardiovascular: Normal rate, regular rhythm and normal heart sounds.  Pulmonary/Chest: Effort normal and breath sounds normal. No respiratory distress. She has no wheezes. She has no rales.  No chest wall tenderness  Abdominal: Soft. Bowel sounds are normal. She exhibits no distension. There is no tenderness. There is no rebound.  Musculoskeletal: She exhibits no edema.  Neurological: She is alert.  Skin: Skin is warm and dry.  Psychiatric: She has a normal mood and affect. Her behavior is normal.  Nursing note and vitals reviewed.    ED Treatments / Results  Labs (all labs ordered are listed, but only abnormal results are displayed) Labs Reviewed  CBC WITH DIFFERENTIAL/PLATELET - Abnormal; Notable for the following components:      Result Value   Hemoglobin 10.3 (*)    HCT 32.7 (*)    MCV 67.8 (*)    MCH 21.4 (*)    RDW 18.5 (*)    All other components within normal limits  BASIC METABOLIC PANEL  TROPONIN I  D-DIMER, QUANTITATIVE (NOT AT [facility])  PREGNANCY, URINE    EKG  EKG Interpretation  Date/Time:  Sunday November 10 2017 13:07:20 EST Ventricular Rate:  88 PR Interval:  126 QRS Duration: 90 QT Interval:  340 QTC Calculation: 411 R Axis:   85 Text Interpretation:  Normal sinus rhythm Moderate voltage criteria for LVH, may be normal variant Borderline ECG No previous tracing Confirmed by Gwyneth Sprout ([id number]) on 11/10/2017 1:54:29 PM       Radiology Dg Chest 2 View  Result Date: 11/10/2017 CLINICAL DATA:  Chest  pain and SOB EXAM: CHEST  2 VIEW COMPARISON:  None. FINDINGS: The heart size and mediastinal contours are within normal limits. Both lungs are clear. Scoliosis deformity involves the thoracic spine. IMPRESSION: No active cardiopulmonary disease. Electronically Signed   By: Signa Kell M.D.   On: 11/10/2017 14:25    Procedures Procedures (including critical care time)  Medications Ordered in ED Medications - No data to display   Initial Impression / Assessment and Plan / ED Course  I have reviewed the triage vital signs and the nursing notes.  Pertinent labs & imaging results that were available during my care of the patient were reviewed by me and considered in my medical  decision making (see chart for details).     Patient in emergency department with atypical left-sided chest pain that radiates into the left shoulder and back.  She is currently in no acute distress.  Her vital signs are all within normal.  She does have a risk factor for possible blood clots with recent travel.  I will get a d-dimer.  Will get blood tests including CBC, B med, troponin.  Will get a chest x-ray.  Will monitor.  Patient does not appeared to be in any distress, nontoxic appearing.  3:06 PM Labs unremarkable including negative troponin and negative d-dimer.  Patient symptoms have been there since yesterday.  Chest x-ray is negative as well.  I suspect her pain is most likely musculoskeletal.  She has heart score of 1.  She is low risk for ACS.  She appears to be in no acute distress at this time.  Vital signs are normal.  She is stable for discharge home at this time with close outpatient follow-up.  Return precautions discussed.  Will start on NSAIDs.  Vitals:   11/10/17 1312 11/10/17 1330 11/10/17 1430  BP: 119/83 120/83 106/78  Pulse: 88 87 78  Resp: 16 17 13   Temp: 99.1 F (37.3 C)    SpO2: 100% 100% 100%     Final Clinical Impressions(s) / ED Diagnoses   Final diagnoses:  Atypical chest pain     ED Discharge Orders        Ordered    naproxen (NAPROSYN) 500 MG tablet  2 times daily     11/10/17 1507       Jaynie CrumbleKirichenko, Khyrin Trevathan, PA-C 11/10/17 1509    Gwyneth SproutPlunkett, Whitney, MD 11/11/17 [phone]

## 2017-11-10 NOTE — ED Triage Notes (Signed)
Pt reports intermittent chest pain since yesterday. States she had pain 15 minutes pta but pain is better now

## 2017-11-14 ENCOUNTER — Telehealth: Payer: Self-pay | Admitting: Pediatrics

## 2017-11-14 MED ORDER — TERCONAZOLE 0.8 % VA CREA: 1.0000 | TOPICAL_CREAM | Freq: Every day | VAGINAL | 0 refills | Status: DC

## 2017-11-14 NOTE — Telephone Encounter (Signed)
Pt left msg on nurse line c/o yeast infection. She requested cream be sent to pharmacy.  I sent rx per standing order.

## 2018-01-30 ENCOUNTER — Ambulatory Visit: Payer: BLUE CROSS/BLUE SHIELD | Admitting: Obstetrics

## 2018-03-03 ENCOUNTER — Ambulatory Visit: Payer: BLUE CROSS/BLUE SHIELD | Admitting: Obstetrics

## 2018-03-17 ENCOUNTER — Ambulatory Visit: Payer: BLUE CROSS/BLUE SHIELD | Admitting: Obstetrics

## 2018-03-24 ENCOUNTER — Telehealth: Payer: Self-pay | Admitting: Oncology

## 2018-03-24 ENCOUNTER — Telehealth: Payer: Self-pay | Admitting: Hematology

## 2018-03-24 ENCOUNTER — Encounter: Payer: Self-pay | Admitting: Hematology

## 2018-03-24 NOTE — Telephone Encounter (Signed)
Appt has been scheduled for the pt to see Dr. Clelia Croft on 5/15 at 215pm. Pt aware to arrive 30 minutes early. Letter mailed.

## 2018-03-24 NOTE — Telephone Encounter (Signed)
Pt called and moved appt to 5/22 at 10am. New letter mailed.

## 2018-03-24 NOTE — Telephone Encounter (Signed)
Pt has been scheduled to see Dr. Candise Che on 5/21 at 10am. Unable to reach the pt. Her vm was full. Letter mailed to the pt and faxed to the referring.

## 2018-04-02 ENCOUNTER — Ambulatory Visit (INDEPENDENT_AMBULATORY_CARE_PROVIDER_SITE_OTHER): Payer: BLUE CROSS/BLUE SHIELD | Admitting: Obstetrics

## 2018-04-02 ENCOUNTER — Ambulatory Visit: Payer: BLUE CROSS/BLUE SHIELD | Admitting: Obstetrics

## 2018-04-02 ENCOUNTER — Encounter: Payer: Self-pay | Admitting: Obstetrics

## 2018-04-02 ENCOUNTER — Other Ambulatory Visit (HOSPITAL_COMMUNITY)
Admission: RE | Admit: 2018-04-02 | Discharge: 2018-04-02 | Disposition: A | Discharge status: Home / Self Care | Payer: BLUE CROSS/BLUE SHIELD | Source: Ambulatory Visit | Attending: Obstetrics | Admitting: Obstetrics

## 2018-04-02 VITALS — BP 115/76 | HR 87 | Wt 124.0 lb

## 2018-04-02 DIAGNOSIS — N898 Other specified noninflammatory disorders of vagina: Secondary | ICD-10-CM

## 2018-04-02 DIAGNOSIS — Z01419 Encounter for gynecological examination (general) (routine) without abnormal findings: Secondary | ICD-10-CM | POA: Diagnosis not present

## 2018-04-02 DIAGNOSIS — D500 Iron deficiency anemia secondary to blood loss (chronic): Secondary | ICD-10-CM

## 2018-04-02 DIAGNOSIS — Z124 Encounter for screening for malignant neoplasm of cervix: Secondary | ICD-10-CM | POA: Diagnosis present

## 2018-04-02 DIAGNOSIS — N939 Abnormal uterine and vaginal bleeding, unspecified: Secondary | ICD-10-CM

## 2018-04-02 DIAGNOSIS — Z9889 Other specified postprocedural states: Secondary | ICD-10-CM

## 2018-04-02 DIAGNOSIS — Z113 Encounter for screening for infections with a predominantly sexual mode of transmission: Secondary | ICD-10-CM

## 2018-04-02 MED ORDER — NORGESTIMATE-ETH ESTRADIOL 0.25-35 MG-MCG PO TABS: 1.0000 | ORAL_TABLET | Freq: Every day | ORAL | 11 refills | Status: DC

## 2018-04-02 NOTE — Progress Notes (Signed)
Subjective:        Katrina Wiley is a 37 y.o. female here for a routine exam.  Current complaints: Heavy periods.  She had HTA last year but didn't change her periods much.  She has a period every ~ 21 days for 5-6 days, with heavy flow the first 2-3 days..    Personal health questionnaire:  Is patient [name], have a family history of breast and/or ovarian cancer: no Is there a family history of uterine cancer diagnosed at age < 72, gastrointestinal cancer, urinary tract cancer, family member who is a Personnel officer syndrome-associated carrier: no Is the patient overweight and hypertensive, family history of diabetes, personal history of gestational diabetes, preeclampsia or PCOS: no Is patient over 48, have PCOS,  family history of premature CHD under age 61, diabetes, smoke, have hypertension or peripheral artery disease:  no At any time, has a partner hit, kicked or otherwise hurt or frightened you?: no Over the past 2 weeks, have you felt down, depressed or hopeless?: no Over the past 2 weeks, have you felt little interest or pleasure in doing things?:no   Gynecologic History Patient's last menstrual period was 03/28/2018 (exact date). Contraception: tubal ligation Last Pap: 2018. Results were: normal Last mammogram: n/a. Results were: n/a  Obstetric History OB History  Gravida Para Term Preterm AB Living  SAB TAB Ectopic Multiple Live Births    1          # Outcome Date GA Lbr Len/2nd Weight Sex Delivery Anes PTL Lv  4 Gravida              Birth Comments: System Generated. Please review and update pregnancy details.  3 TAB           2 Preterm           1 Term             Past Medical History:  Diagnosis Date  . Abnormal vaginal Pap smear    cryo done in Dr Verdell Carmine office  . Anemia   . Hyperlipidemia    no meds, diet controlled  . Preterm labor   . SVD (spontaneous vaginal delivery)    x 3    Past Surgical History:  Procedure Laterality  Date  . DILATION AND CURETTAGE OF UTERUS    . DILITATION & CURRETTAGE/HYSTROSCOPY WITH HYDROTHERMAL ABLATION N/A 06/13/2017   Procedure: DILATATION & CURETTAGE/HYSTEROSCOPY WITH ATTEMPTED HYDROTHERMAL ABLATION;  Surgeon: Allie Bossier, MD;  Location: [facility];  Service: Gynecology;  Laterality: N/A;  . INCISION AND DRAINAGE PERIRECTAL ABSCESS    . TUBAL LIGATION    . WISDOM TOOTH EXTRACTION       Current Outpatient Medications:  .  clindamycin-benzoyl peroxide (BENZACLIN) gel, Apply 1 application topically daily as needed (breakouts). Reported on 02/08/2016, Disp: , Rfl:  .  Fe Cbn-Fe Gluc-FA-B12-C-DSS (FERRALET 90) 90-1 MG TABS, Take 1 tablet by mouth daily before breakfast. (Patient not taking: Reported on 04/02/2018), Disp: 30 each, Rfl: 11 .  ibuprofen (ADVIL,MOTRIN) 600 MG tablet, Take 1 tablet (600 mg total) by mouth every 6 (six) hours as needed. (Patient not taking: Reported on 04/02/2018), Disp: 30 tablet, Rfl: 1 .  LO LOESTRIN FE 1 MG-10 MCG / 10 MCG tablet, Take 1 tablet daily by mouth. (Patient not taking: Reported on 04/02/2018), Disp: 3 Package, Rfl: 4 .  metroNIDAZOLE (FLAGYL) 500 MG tablet, Take 1 tablet (500 mg total) by mouth 2 (  two) times daily. (Patient not taking: Reported on 04/02/2018), Disp: 14 tablet, Rfl: 2 .  naproxen (NAPROSYN) 500 MG tablet, Take 1 tablet (500 mg total) by mouth 2 (two) times daily. (Patient not taking: Reported on 04/02/2018), Disp: 30 tablet, Rfl: 0 .  norgestimate-ethinyl estradiol (ORTHO-CYCLEN,SPRINTEC,PREVIFEM) 0.25-35 MG-MCG tablet, Take 1 tablet by mouth daily., Disp: 1 Package, Rfl: 11 .  oxyCODONE-acetaminophen (PERCOCET/ROXICET) 5-325 MG tablet, Take 1-2 tablets by mouth every 6 (six) hours as needed. (Patient not taking: Reported on 07/25/2017), Disp: 15 tablet, Rfl: 0 .  terconazole (TERAZOL 3) 0.8 % vaginal cream, Place 1 applicator vaginally at bedtime. (Patient not taking: Reported on 04/02/2018), Disp: 20 g, Rfl: 0 No Known Allergies  Social History    Tobacco Use  . Smoking status: Never Smoker  . Smokeless tobacco: Never Used  Substance Use Topics  . Alcohol use: Yes    Alcohol/week: 0.0 oz    Comment: occasional    History reviewed. No pertinent family history.    Review of Systems  Constitutional: negative for fatigue and weight loss Respiratory: negative for cough and wheezing Cardiovascular: negative for chest pain, fatigue and palpitations Gastrointestinal: negative for abdominal pain and change in bowel habits Musculoskeletal:negative for myalgias Neurological: negative for gait problems and tremors Behavioral/Psych: negative for abusive relationship, depression Endocrine: negative for temperature intolerance    Genitourinary:POSITIVE for abnormal menstrual periods - HEAVY Integument/breast: negative for breast lump, breast tenderness, nipple discharge and skin lesion(s)    Objective:       BP 115/76   Pulse 87   Wt 124 lb (56.2 kg)   LMP 03/28/2018 (Exact Date)   BMI 21.97 kg/m  General:   alert  Skin:   no rash or abnormalities  Lungs:   clear to auscultation bilaterally  Heart:   regular rate and rhythm, S1, S2 normal, no murmur, click, rub or gallop  Breasts:   normal without suspicious masses, skin or nipple changes or axillary nodes  Abdomen:  normal findings: no organomegaly, soft, non-tender and no hernia  Pelvis:  External genitalia: normal general appearance Urinary system: urethral meatus normal and bladder without fullness, nontender Vaginal: normal without tenderness, induration or masses Cervix: normal appearance Adnexa: normal bimanual exam Uterus: anteverted and non-tender, normal size   Lab Review Urine pregnancy test Labs reviewed yes Radiologic studies reviewed no  50% of 20 min visit spent on counseling and coordination of care.   Assessment:     1. Encounter for annual routine gynecological examination  2. Screening for cervical cancer Rx: - Cytology - PAP  3. Abnormal  uterine bleeding (AUB) Rx: - norgestimate-ethinyl estradiol (ORTHO-CYCLEN,SPRINTEC,PREVIFEM) 0.25-35 MG-MCG tablet; Take 1 tablet by mouth daily.  Dispense: 1 Package; Refill: 11  4. Iron deficiency anemia due to chronic blood loss - followed by Hematology  5. S/P endometrial ablation  6. Vaginal discharge Rx: - Cervicovaginal ancillary only  7. Screening for STD (sexually transmitted disease) Rx: - Hepatitis B surface antigen - Hepatitis C antibody - HIV antibody - RPR    Plan:    Education reviewed: calcium supplements, depression evaluation, low fat, low cholesterol diet, safe sex/STD prevention, self breast exams and weight bearing exercise. Follow up in: 3 months.   Meds ordered this encounter  Medications  . norgestimate-ethinyl estradiol (ORTHO-CYCLEN,SPRINTEC,PREVIFEM) 0.25-35 MG-MCG tablet    Sig: Take 1 tablet by mouth daily.    Dispense:  1 Package    Refill:  11   Orders Placed This Encounter  Procedures  .  Hepatitis B surface antigen  . Hepatitis C antibody  . HIV antibody  . RPR    Brock Bad MD 04-02-2018

## 2018-04-02 NOTE — Progress Notes (Signed)
Patient presents for Annual Exam today.  CC: Request CBC due to Hx of Anemia per pt.  LMP: 03/28/2018 every 21 weeks  Last pap: 03/29/2017 WNL   Contraception: BTL considering BCP to help regulate cycle STD Screening: Full Panel

## 2018-04-03 LAB — CERVICOVAGINAL ANCILLARY ONLY
BACTERIAL VAGINITIS: NEGATIVE
CANDIDA VAGINITIS: NEGATIVE
Chlamydia: NEGATIVE
NEISSERIA GONORRHEA: NEGATIVE
TRICH (WINDOWPATH): NEGATIVE

## 2018-04-03 LAB — RPR: RPR: NONREACTIVE

## 2018-04-03 LAB — HIV ANTIBODY (ROUTINE TESTING W REFLEX): HIV SCREEN 4TH GENERATION: NONREACTIVE

## 2018-04-03 LAB — HEPATITIS B SURFACE ANTIGEN: HEP B S AG: NEGATIVE

## 2018-04-03 LAB — HEPATITIS C ANTIBODY: Hep C Virus Ab: <0.1 s/co ratio (ref 0.0–0.9)

## 2018-04-04 LAB — CYTOLOGY - PAP
Diagnosis: NEGATIVE
HPV: NOT DETECTED

## 2018-04-14 NOTE — Progress Notes (Signed)
HEMATOLOGY/ONCOLOGY CONSULTATION NOTE  Date of Service: 04/16/18  Patient Care Team: [name], MD as PCP - General (Family Medicine)  CHIEF COMPLAINTS/PURPOSE OF CONSULTATION:  Iron Deficiency Anemia  HISTORY OF PRESENTING ILLNESS:   Katrina Wiley is a wonderful 37 y.o. female who has been referred to [place] by Dr [name] for evaluation and management of Iron Deficiency Anemia. The pt reports that she is doing well overall.   The pt reports that she has had four pregnancies and has three children, 41, 11 and 68 years old. She notes that she breast fed for 3 months each time. She notes that she had anemia in her pregnancies.  She notes that she had an endometrial ablation one year ago for her heavy periods which were 6 days in total and were heavy for 3 days, and occurred every 21 days. She denies blood transfusions or receiving IV iron in the past. She denies having fibroids at any time. She took PO iron pills, Ferralet, for a couple months and is not sure if this helped her lab work. She had some constipation with PO iron pills and is hesitant to restarting PO Iron. She denies being told of her anemia pre-puberty.   She notes that her periods are still frequent and are heavy for one day even after her endometrial ablation. She is taking oral contraceptives for cycle regulation.   She notes being very tired often. She notes that she has cravings for all purpose flour.   Her mom had a blood transfusion two years ago for her anemia, and her maternal family has sickle cell trait.    Most recent lab results (12/20/17) of CBC  is as follows: all values are WNL except for HGB at 10.5, HCT at 33.9, MCV at 68.2, MCH at 21.0, MCHC at 30.8, RDW at 18.5.  On review of systems, pt reports fatigue, pica, heavy periods, and denies nose bleeds, gum bleeds, other concerns for blood loss, leg swelling and any other symptoms.   On PMHx the pt reports iron deficiency anemia, sickle cell trait,  scoliosis. Endometrial ablation. She denies drug allergies, seasonal allergies, food allergies, bee stings.  On Social Hx the pt reports social ETOH use, and denies smoking. 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 On Family Hx the pt reports maternal sickle cell trait.    MEDICAL HISTORY:  Past Medical History:  Diagnosis Date  . Abnormal vaginal Pap smear    cryo done in Dr Verdell Carmine office  . Anemia   . Hyperlipidemia    no meds, diet controlled  . Preterm labor   . SVD (spontaneous vaginal delivery)    x 3    SURGICAL HISTORY: Past Surgical History:  Procedure Laterality Date  . DILATION AND CURETTAGE OF UTERUS    . DILITATION & CURRETTAGE/HYSTROSCOPY WITH HYDROTHERMAL ABLATION N/A 06/13/2017   Procedure: DILATATION & CURETTAGE/HYSTEROSCOPY WITH ATTEMPTED HYDROTHERMAL ABLATION;  Surgeon: Allie Bossier, MD;  Location: [facility];  Service: Gynecology;  Laterality: N/A;  . INCISION AND DRAINAGE PERIRECTAL ABSCESS    . TUBAL LIGATION    . WISDOM TOOTH EXTRACTION      SOCIAL HISTORY: Social History   Socioeconomic History  . Marital status: Single    Spouse name: Not on file  . Number of children: Not on file  . Years of education: Not on file  . Highest education level: Not on file  Occupational History  . Not on file  Social Needs  .  Financial resource strain: Not on file  . Food insecurity:    Worry: Not on file    Inability: Not on file  . Transportation needs:    Medical: Not on file    Non-medical: Not on file  Tobacco Use  . Smoking status: Never Smoker  . Smokeless tobacco: Never Used  Substance and Sexual Activity  . Alcohol use: Yes    Alcohol/week: 0.0 oz    Comment: occasional  . Drug use: No  . Sexual activity: Yes    Partners: Male    Birth control/protection: Condom, Surgical  Lifestyle  . Physical activity:    Days per week: Not on file    Minutes per session: Not on file  . Stress: Not on file  Relationships  . Social  connections:    Talks on phone: Not on file    Gets together: Not on file    Attends religious service: Not on file    Active member of club or organization: Not on file    Attends meetings of clubs or organizations: Not on file    Relationship status: Not on file  . Intimate partner violence:    Fear of current or ex partner: Not on file    Emotionally abused: Not on file    Physically abused: Not on file    Forced sexual activity: Not on file  Other Topics Concern  . Not on file  Social History Narrative  . Not on file    FAMILY HISTORY: No family history on file.  ALLERGIES:  has No Known Allergies.  MEDICATIONS:  Current Outpatient Medications  Medication Sig Dispense Refill  . clindamycin-benzoyl peroxide (BENZACLIN) gel Apply 1 application topically daily as needed (breakouts). Reported on 02/08/2016    . Fe Cbn-Fe Gluc-FA-B12-C-DSS (FERRALET 90) 90-1 MG TABS Take 1 tablet by mouth daily before breakfast. (Patient not taking: Reported on 04/02/2018) 30 each 11  . ibuprofen (ADVIL,MOTRIN) 600 MG tablet Take 1 tablet (600 mg total) by mouth every 6 (six) hours as needed. (Patient not taking: Reported on 04/02/2018) 30 tablet 1  . LO LOESTRIN FE 1 MG-10 MCG / 10 MCG tablet Take 1 tablet daily by mouth. (Patient not taking: Reported on 04/02/2018) 3 Package 4  . metroNIDAZOLE (FLAGYL) 500 MG tablet Take 1 tablet (500 mg total) by mouth 2 (two) times daily. (Patient not taking: Reported on 04/02/2018) 14 tablet 2  . naproxen (NAPROSYN) 500 MG tablet Take 1 tablet (500 mg total) by mouth 2 (two) times daily. (Patient not taking: Reported on 04/02/2018) 30 tablet 0  . norgestimate-ethinyl estradiol (ORTHO-CYCLEN,SPRINTEC,PREVIFEM) 0.25-35 MG-MCG tablet Take 1 tablet by mouth daily. 1 Package 11  . oxyCODONE-acetaminophen (PERCOCET/ROXICET) 5-325 MG tablet Take 1-2 tablets by mouth every 6 (six) hours as needed. (Patient not taking: Reported on 07/25/2017) 15 tablet 0  . terconazole (TERAZOL 3)  0.8 % vaginal cream Place 1 applicator vaginally at bedtime. (Patient not taking: Reported on 04/02/2018) 20 g 0   No current facility-administered medications for this visit.     REVIEW OF SYSTEMS:    10 Point review of Systems was done is negative except as noted above.  PHYSICAL EXAMINATION:  . Vitals:   04/16/18 1017  BP: 118/86  Pulse: 76  Resp: 18  Temp: 98.5 F (36.9 C)  SpO2: 100%   Filed Weights   04/16/18 1017  Weight: 122 lb 12.8 oz (55.7 kg)   .Body mass index is 21.75 kg/m.  GENERAL:alert, in no acute  distress and comfortable SKIN: no acute rashes, no significant lesions EYES: conjunctiva are pink and non-injected, sclera anicteric OROPHARYNX: MMM, no exudates, no oropharyngeal erythema or ulceration NECK: supple, no JVD LYMPH:  no palpable lymphadenopathy in the cervical, axillary or inguinal regions LUNGS: clear to auscultation b/l with normal respiratory effort HEART: regular rate & rhythm ABDOMEN:  normoactive bowel sounds , non tender, not distended. Extremity: no pedal edema PSYCH: alert & oriented x 3 with fluent speech NEURO: no focal motor/sensory deficits  LABORATORY DATA:  I have reviewed the data as listed  . CBC Latest Ref Rng & Units 04/16/2018 11/10/2017 06/07/2017  WBC 3.9 - 10.3 K/uL 4.4 4.7 4.7  Hemoglobin 11.6 - 15.9 g/dL 11.0(L) 10.3(L) 9.9(L)  Hematocrit 34.8 - 46.6 % 35.1 32.7(L) 31.0(L)  Platelets 145 - 400 K/uL 249 340 210   . CBC    Component Value Date/Time   WBC 4.4 04/16/2018 1117   RBC 5.11 04/16/2018 1117   HGB 11.0 (L) 04/16/2018 1117   HCT 35.1 04/16/2018 1117   PLT 249 04/16/2018 1117   MCV 68.6 (L) 04/16/2018 1117   MCH 21.5 (L) 04/16/2018 1117   MCHC 31.4 (L) 04/16/2018 1117   RDW 19.3 (H) 04/16/2018 1117   LYMPHSABS 1.4 04/16/2018 1117   MONOABS 0.2 04/16/2018 1117   EOSABS 0.0 04/16/2018 1117   BASOSABS 0.0 04/16/2018 1117    . CMP Latest Ref Rng & Units 04/16/2018 11/10/2017 01/21/2017  Glucose 70 -  140 mg/dL 75 82 409(W)  BUN 7 - 26 mg/dL Creatinine 0.60 - 1.10 mg/dL 1.19 1.47 8.29  Sodium 136 - 145 mmol/L 139 137 140  Potassium 3.5 - 5.1 mmol/L 3.5 3.6 3.2(L)  Chloride 98 - 109 mmol/L 105 104 107  CO2 22 - 29 mmol/L Calcium 8.4 - 10.4 mg/dL 9.7 9.3 9.3  Total Protein 6.4 - 8.3 g/dL 5.6(O) - 7.6  Total Bilirubin 0.2 - 1.2 mg/dL 0.5 - 0.5  Alkaline Phos 40 - 150 U/L 50 - 46  AST 5 - 34 U/L 17 - 17  ALT 0 - 55 U/L 13 - 9(L)   . Lab Results  Component Value Date   IRON 27 (L) 04/16/2018   TIBC 486 (H) 04/16/2018   IRONPCTSAT 6 (L) 04/16/2018   (Iron and TIBC)  Lab Results  Component Value Date   FERRITIN <4 (L) 04/16/2018      12/24/17 CBC:    RADIOGRAPHIC STUDIES: I have personally reviewed the radiological images as listed and agreed with the findings in the report. No results found.  ASSESSMENT & PLAN:  37 y.o. female with  1.Microcytic Anemia due to severe iron deficiency 2. Severe irion deficiency due to heavy menstrual losses. Not responsive to PO iron replacement 3. Pica symptoms related to Iron deficiency anemia 4. Sickle Cell trait PLAN -Discussed patient's most recent labs from 12/20/17, anemia noted with Hgb at 10.5 and microcytic RBCs with MCV at 68.2 -labs done today and reviewed --severe iron deficiency with undetectable ferritin levels <4 -Will send out labs and hemoglobin electrophoresis -proceed with  IV Injectafer x2 doses as she has tried PO iron in the past. Discussed pros/cons/alternatives and possible adverse effect. Informed consent obtained from patient to proceed. -Will see pt back in 2 months with repeat labs to assess response to IV iron and determine additional needs. -recommended patient take OTC B complex 1 cap po daily to support accelerated hematopoiesis from IV iron replacement. -f/u  with Gyn to address heavy menstrual losses (patient notes these have improved since her endometrial ablation about a year  ago) -advice patient on iron rich foods she could consume.  Labs today IV Injectafer weekly x 2 doses RTC with Dr Candise Che in 2 months with labs    All of the patients questions were answered with apparent satisfaction. The patient knows to call the clinic with any problems, questions or concerns.  . The total time spent in the appointment was 45 minutes and more than 50% was on counseling and direct patient cares.  Wyvonnia Lora MD MS AAHIVMS [facility]/Oncology Physician [facility]  (Office):       [phone] (Work cell):  [phone] (Fax):           [phone]  04/16/2018 10:25 AM  This document serves as a record of services personally performed by Wyvonnia Lora, MD. It was created on his behalf by Marcelline Mates, a trained medical scribe. The creation of this record is based on the scribe's personal observations and the provider's statements to them.   .I have reviewed the above documentation for accuracy and completeness, and I agree with the above. Johney Maine MD MS

## 2018-04-15 ENCOUNTER — Encounter: Payer: BLUE CROSS/BLUE SHIELD | Admitting: Hematology

## 2018-04-16 ENCOUNTER — Telehealth: Payer: Self-pay | Admitting: Hematology

## 2018-04-16 ENCOUNTER — Inpatient Hospital Stay: Payer: BLUE CROSS/BLUE SHIELD | Attending: Hematology | Admitting: Hematology

## 2018-04-16 ENCOUNTER — Inpatient Hospital Stay: Payer: BLUE CROSS/BLUE SHIELD

## 2018-04-16 ENCOUNTER — Encounter: Payer: Self-pay | Admitting: Hematology

## 2018-04-16 VITALS — BP 118/86 | HR 76 | Temp 98.5°F | Resp 18 | Ht 63.0 in | Wt 122.8 lb

## 2018-04-16 DIAGNOSIS — F5089 Other specified eating disorder: Secondary | ICD-10-CM

## 2018-04-16 DIAGNOSIS — D573 Sickle-cell trait: Secondary | ICD-10-CM

## 2018-04-16 DIAGNOSIS — D500 Iron deficiency anemia secondary to blood loss (chronic): Secondary | ICD-10-CM

## 2018-04-16 DIAGNOSIS — D509 Iron deficiency anemia, unspecified: Secondary | ICD-10-CM

## 2018-04-16 DIAGNOSIS — N920 Excessive and frequent menstruation with regular cycle: Secondary | ICD-10-CM | POA: Diagnosis not present

## 2018-04-16 LAB — CBC WITH DIFFERENTIAL/PLATELET
Basophils Absolute: 0 10*3/uL (ref 0.0–0.1)
Basophils Relative: 1 %
EOS ABS: 0 10*3/uL (ref 0.0–0.5)
EOS PCT: 0 %
HCT: 35.1 % (ref 34.8–46.6)
Hemoglobin: 11 g/dL — ABNORMAL LOW (ref 11.6–15.9)
LYMPHS ABS: 1.4 10*3/uL (ref 0.9–3.3)
Lymphocytes Relative: 31 %
MCH: 21.5 pg — AB (ref 25.1–34.0)
MCHC: 31.4 g/dL — ABNORMAL LOW (ref 31.5–36.0)
MCV: 68.6 fL — ABNORMAL LOW (ref 79.5–101.0)
Monocytes Absolute: 0.2 10*3/uL (ref 0.1–0.9)
Monocytes Relative: 4 %
Neutro Abs: 2.8 10*3/uL (ref 1.5–6.5)
Neutrophils Relative %: 64 %
PLATELETS: 249 10*3/uL (ref 145–400)
RBC: 5.11 MIL/uL (ref 3.70–5.45)
RDW: 19.3 % — ABNORMAL HIGH (ref 11.2–14.5)
WBC: 4.4 10*3/uL (ref 3.9–10.3)

## 2018-04-16 LAB — CMP (CANCER CENTER ONLY)
ALK PHOS: 50 U/L (ref 40–150)
ALT: 13 U/L (ref 0–55)
AST: 17 U/L (ref 5–34)
Albumin: 4.5 g/dL (ref 3.5–5.0)
Anion gap: 7 (ref 3–11)
BUN: 11 mg/dL (ref 7–26)
CALCIUM: 9.7 mg/dL (ref 8.4–10.4)
CO2: 27 mmol/L (ref 22–29)
CREATININE: 0.77 mg/dL (ref 0.60–1.10)
Chloride: 105 mmol/L (ref 98–109)
Glucose, Bld: 75 mg/dL (ref 70–140)
Potassium: 3.5 mmol/L (ref 3.5–5.1)
Sodium: 139 mmol/L (ref 136–145)
Total Bilirubin: 0.5 mg/dL (ref 0.2–1.2)
Total Protein: 8.6 g/dL — ABNORMAL HIGH (ref 6.4–8.3)

## 2018-04-16 LAB — VITAMIN B12: Vitamin B-12: 442 pg/mL (ref 180–914)

## 2018-04-16 LAB — IRON AND TIBC
IRON: 27 ug/dL — AB (ref 41–142)
Saturation Ratios: 6 % — ABNORMAL LOW (ref 21–57)
TIBC: 486 ug/dL — ABNORMAL HIGH (ref 236–444)
UIBC: 459 ug/dL

## 2018-04-16 LAB — FERRITIN

## 2018-04-16 NOTE — Patient Instructions (Addendum)
Thank you for choosing [facility] to provide your oncology and hematology care.  To afford each patient quality time with our providers, please arrive 30 minutes before your scheduled appointment time.  If you arrive late for your appointment, you may be asked to reschedule.  We strive to give you quality time with our providers, and arriving late affects you and other patients whose appointments are after yours.   If you are a no show for multiple scheduled visits, you may be dismissed from the clinic at the providers discretion.    Again, thank you for choosing [facility], our hope is that these requests will decrease the amount of time that you wait before being seen by our physicians.  ______________________________________________________________________  Should you have questions after your visit to the  Cancer Center, please contact our office at [phone] between the hours of 8:30 and 4:30 p.m.    Voicemails left after 4:30p.m will not be returned until the following business day.    For prescription refill requests, please have your pharmacy contact us directly.  Please also try to allow 48 hours for prescription requests.    Please contact the scheduling department for questions regarding scheduling.  For scheduling of procedures such as PET scans, CT scans, MRI, Ultrasound, etc please contact central scheduling at [phone].    Resources For Cancer Patients and Caregivers:   Oncolink.org:  A wonderful resource for patients and healthcare providers for information regarding your disease, ways to tract your treatment, what to expect, etc.     American Cancer Society:  800-227-2345  Can help patients locate various types of support and financial assistance  Cancer Care: 1-800-813-HOPE (4673) Provides financial assistance, online support groups, medication/co-pay assistance.    [place] DSS:  336-641-3447 Where to apply for food  stamps, Medicaid, and utility assistance  Medicare Rights Center: 800-333-4114 Helps people with Medicare understand their rights and benefits, navigate the Medicare system, and secure the quality healthcare they deserve  SCAT: 336-333-6589 [place] [employer]'s shared-ride transportation service for eligible riders who have a disability that prevents them from riding the fixed route bus.    For additional information on assistance programs please contact our social worker:   Grier Hock/Abigail Elmore:  [phone]            

## 2018-04-16 NOTE — Telephone Encounter (Signed)
Appointments scheduled AVS/Calendar printed per 5/22 los °

## 2018-04-18 LAB — HEMOGLOBINOPATHY EVALUATION
HGB A2 QUANT: 3.4 % — AB (ref 1.8–3.2)
HGB F QUANT: 0 % (ref 0.0–2.0)
HGB S QUANTITAION: 30.8 % — AB
Hgb A: 65.8 % — ABNORMAL LOW (ref 96.4–98.8)
Hgb C: 0 %
Hgb Variant: 0 %

## 2018-04-22 ENCOUNTER — Ambulatory Visit (HOSPITAL_COMMUNITY)
Admission: RE | Admit: 2018-04-22 | Discharge: 2018-04-22 | Disposition: A | Discharge status: Home / Self Care | Payer: BLUE CROSS/BLUE SHIELD | Source: Ambulatory Visit | Attending: Hematology | Admitting: Hematology

## 2018-04-22 DIAGNOSIS — D509 Iron deficiency anemia, unspecified: Secondary | ICD-10-CM | POA: Diagnosis present

## 2018-04-22 MED ORDER — SODIUM CHLORIDE 0.9 % IV SOLN
Freq: Once | INTRAVENOUS | Status: AC
  Administered 2018-04-22: 09:00:00 via INTRAVENOUS

## 2018-04-22 MED ORDER — FERRIC CARBOXYMALTOSE 750 MG/15ML IV SOLN
750.0000 mg | Freq: Once | INTRAVENOUS | Status: AC
  Administered 2018-04-22: 750 mg via INTRAVENOUS
  Filled 2018-04-22: qty 15

## 2018-04-22 NOTE — Progress Notes (Addendum)
PATIENT CARE CENTER NOTE  Diagnosis: Anemia    Provider: Dr. Candise Che   Procedure: IV Injectafor   Note: Patient received infusion of IV Injectafor. Patient tolerated infusion well with no adverse reaction. Patient monitored for 30 minutes after infusion and vital signs taken. Discharge instructions given to patient. Patient alert, oriented and ambulatory at discharge.

## 2018-04-22 NOTE — Discharge Instructions (Signed)
Ferric carboxymaltose injection What is this medicine? FERRIC CARBOXYMALTOSE (ferr-ik car-box-ee-mol-toes) is an iron complex. Iron is used to make healthy red blood cells, which carry oxygen and nutrients throughout the body. This medicine is used to treat anemia in people with chronic kidney disease or people who cannot take iron by mouth. This medicine may be used for other purposes; ask your health care provider or pharmacist if you have questions. COMMON BRAND NAME(S): Injectafer What should I tell my health care provider before I take this medicine? They need to know if you have any of these conditions: -anemia not caused by low iron levels -high levels of iron in the blood -liver disease -an unusual or allergic reaction to iron, other medicines, foods, dyes, or preservatives -pregnant or trying to get pregnant -breast-feeding How should I use this medicine? This medicine is for infusion into a vein. It is given by a health care professional in a hospital or clinic setting. Talk to your pediatrician regarding the use of this medicine in children. Special care may be needed. Overdosage: If you think you have taken too much of this medicine contact a poison control center or emergency room at once. NOTE: This medicine is only for you. Do not share this medicine with others. What if I miss a dose? It is important not to miss your dose. Call your doctor or health care professional if you are unable to keep an appointment. What may interact with this medicine? Do not take this medicine with any of the following medications: -deferoxamine -dimercaprol -other iron products This medicine may also interact with the following medications: -chloramphenicol -deferasirox This list may not describe all possible interactions. Give your health care provider a list of all the medicines, herbs, non-prescription drugs, or dietary supplements you use. Also tell them if you smoke, drink alcohol, or use  illegal drugs. Some items may interact with your medicine. What should I watch for while using this medicine? Visit your doctor or health care professional regularly. Tell your doctor if your symptoms do not start to get better or if they get worse. You may need blood work done while you are taking this medicine. You may need to follow a special diet. Talk to your doctor. Foods that contain iron include: whole grains/cereals, dried fruits, beans, or peas, leafy green vegetables, and organ meats (liver, kidney). What side effects may I notice from receiving this medicine? Side effects that you should report to your doctor or health care professional as soon as possible: -allergic reactions like skin rash, itching or hives, swelling of the face, lips, or tongue -breathing problems -changes in blood pressure -feeling faint or lightheaded, falls -flushing, sweating, or hot feelings Side effects that usually do not require medical attention (report to your doctor or health care professional if they continue or are bothersome): -changes in taste -constipation -dizziness -headache -nausea -pain, redness, or irritation at site where injected -vomiting This list may not describe all possible side effects. Call your doctor for medical advice about side effects. You may report side effects to FDA at 1-800-FDA-1088. Where should I keep my medicine? This drug is given in a hospital or clinic and will not be stored at home. NOTE: This sheet is a summary. It may not cover all possible information. If you have questions about this medicine, talk to your doctor, pharmacist, or health care provider.  2018 Elsevier/Gold Standard (2015-12-15 11:20:47)  

## 2018-04-28 ENCOUNTER — Encounter (HOSPITAL_COMMUNITY): Payer: BLUE CROSS/BLUE SHIELD

## 2018-04-29 ENCOUNTER — Ambulatory Visit (HOSPITAL_COMMUNITY)
Admission: RE | Admit: 2018-04-29 | Discharge: 2018-04-29 | Disposition: A | Discharge status: Home / Self Care | Payer: BLUE CROSS/BLUE SHIELD | Source: Ambulatory Visit | Attending: Hematology | Admitting: Hematology

## 2018-04-29 DIAGNOSIS — D509 Iron deficiency anemia, unspecified: Secondary | ICD-10-CM | POA: Insufficient documentation

## 2018-04-29 MED ORDER — SODIUM CHLORIDE 0.9 % IV SOLN
750.0000 mg | Freq: Once | INTRAVENOUS | Status: AC
  Administered 2018-04-29: 750 mg via INTRAVENOUS
  Filled 2018-04-29: qty 15

## 2018-04-29 MED ORDER — SODIUM CHLORIDE 0.9 % IV SOLN
Freq: Once | INTRAVENOUS | Status: AC
  Administered 2018-04-29: 10:00:00 via INTRAVENOUS

## 2018-04-29 NOTE — Progress Notes (Signed)
PATIENT CARE CENTER NOTE  Diagnosis: Anemia    Provider: Dr. Candise CheKale   Procedure: IV Injectafor    Note: Patient received infusion of IV Injectafor. Patient tolerated infusion well with no adverse reaction. Patient monitored for 30 minutes after infusion and vital signs taken. Discharge instructions given to patient. Patient alert, oriented and ambulatory at discharge.

## 2018-04-29 NOTE — Discharge Instructions (Signed)
Ferric carboxymaltose injection What is this medicine? FERRIC CARBOXYMALTOSE (ferr-ik car-box-ee-mol-toes) is an iron complex. Iron is used to make healthy red blood cells, which carry oxygen and nutrients throughout the body. This medicine is used to treat anemia in people with chronic kidney disease or people who cannot take iron by mouth. This medicine may be used for other purposes; ask your health care provider or pharmacist if you have questions. COMMON BRAND NAME(S): Injectafer What should I tell my health care provider before I take this medicine? They need to know if you have any of these conditions: -anemia not caused by low iron levels -high levels of iron in the blood -liver disease -an unusual or allergic reaction to iron, other medicines, foods, dyes, or preservatives -pregnant or trying to get pregnant -breast-feeding How should I use this medicine? This medicine is for infusion into a vein. It is given by a health care professional in a hospital or clinic setting. Talk to your pediatrician regarding the use of this medicine in children. Special care may be needed. Overdosage: If you think you have taken too much of this medicine contact a poison control center or emergency room at once. NOTE: This medicine is only for you. Do not share this medicine with others. What if I miss a dose? It is important not to miss your dose. Call your doctor or health care professional if you are unable to keep an appointment. What may interact with this medicine? Do not take this medicine with any of the following medications: -deferoxamine -dimercaprol -other iron products This medicine may also interact with the following medications: -chloramphenicol -deferasirox This list may not describe all possible interactions. Give your health care provider a list of all the medicines, herbs, non-prescription drugs, or dietary supplements you use. Also tell them if you smoke, drink alcohol, or use  illegal drugs. Some items may interact with your medicine. What should I watch for while using this medicine? Visit your doctor or health care professional regularly. Tell your doctor if your symptoms do not start to get better or if they get worse. You may need blood work done while you are taking this medicine. You may need to follow a special diet. Talk to your doctor. Foods that contain iron include: whole grains/cereals, dried fruits, beans, or peas, leafy green vegetables, and organ meats (liver, kidney). What side effects may I notice from receiving this medicine? Side effects that you should report to your doctor or health care professional as soon as possible: -allergic reactions like skin rash, itching or hives, swelling of the face, lips, or tongue -breathing problems -changes in blood pressure -feeling faint or lightheaded, falls -flushing, sweating, or hot feelings Side effects that usually do not require medical attention (report to your doctor or health care professional if they continue or are bothersome): -changes in taste -constipation -dizziness -headache -nausea -pain, redness, or irritation at site where injected -vomiting This list may not describe all possible side effects. Call your doctor for medical advice about side effects. You may report side effects to FDA at 1-800-FDA-1088. Where should I keep my medicine? This drug is given in a hospital or clinic and will not be stored at home. NOTE: This sheet is a summary. It may not cover all possible information. If you have questions about this medicine, talk to your doctor, pharmacist, or health care provider.  2018 Elsevier/Gold Standard (2015-12-15 11:20:47)  

## 2018-05-07 ENCOUNTER — Telehealth: Payer: Self-pay

## 2018-05-07 NOTE — Telephone Encounter (Signed)
Pt called stating she has been having heavy bleeding with her cycle going on 7 days now. Pt states she's has h/o anemia and just received an iron transfusion a couple weeks ago. Pt states she did take the birth control pills given to her at her last visit but still is experiencing the heavy period.

## 2018-05-08 ENCOUNTER — Other Ambulatory Visit: Payer: Self-pay | Admitting: Obstetrics

## 2018-05-08 DIAGNOSIS — N939 Abnormal uterine and vaginal bleeding, unspecified: Secondary | ICD-10-CM

## 2018-05-08 MED ORDER — MEDROXYPROGESTERONE ACETATE 10 MG PO TABS: ORAL_TABLET | ORAL | 0 refills | Status: DC

## 2018-05-08 NOTE — Telephone Encounter (Signed)
[facility] Rx for AUB.  I've been trying to call patient, without success.   Will you try to call her, and if you get her and let her know the Rx is at her Pharmacy., I will be happy to talk with her if she has any questions.

## 2018-06-17 ENCOUNTER — Other Ambulatory Visit: Payer: Self-pay

## 2018-06-17 DIAGNOSIS — D509 Iron deficiency anemia, unspecified: Secondary | ICD-10-CM

## 2018-06-18 ENCOUNTER — Telehealth: Payer: Self-pay | Admitting: Hematology

## 2018-06-18 ENCOUNTER — Inpatient Hospital Stay: Payer: BLUE CROSS/BLUE SHIELD | Attending: Hematology

## 2018-06-18 ENCOUNTER — Inpatient Hospital Stay: Payer: BLUE CROSS/BLUE SHIELD | Admitting: Hematology

## 2018-06-18 NOTE — Telephone Encounter (Signed)
Tried to reach regarding voicemail it was full

## 2018-06-27 ENCOUNTER — Telehealth: Payer: Self-pay

## 2018-06-27 ENCOUNTER — Other Ambulatory Visit: Payer: Self-pay | Admitting: Obstetrics

## 2018-06-27 DIAGNOSIS — N760 Acute vaginitis: Secondary | ICD-10-CM

## 2018-06-27 DIAGNOSIS — B9689 Other specified bacterial agents as the cause of diseases classified elsewhere: Secondary | ICD-10-CM

## 2018-06-27 MED ORDER — METRONIDAZOLE 0.75 % VA GEL: 1.0000 | Freq: Two times a day (BID) | VAGINAL | 2 refills | Status: DC

## 2018-06-27 NOTE — Telephone Encounter (Signed)
Returned call, pt states that she has flagyl and is having trouble taking pills, pt would prefer metrogel instead. Routed to provider for review.

## 2018-06-27 NOTE — Telephone Encounter (Signed)
[facility] Rx for BV 

## 2018-07-01 ENCOUNTER — Telehealth: Payer: Self-pay | Admitting: Hematology

## 2018-07-01 NOTE — Telephone Encounter (Signed)
Patient called to reschedule  °

## 2018-08-15 ENCOUNTER — Inpatient Hospital Stay: Payer: Self-pay | Admitting: Hematology

## 2018-08-15 ENCOUNTER — Inpatient Hospital Stay: Payer: Self-pay | Attending: Hematology

## 2018-11-12 ENCOUNTER — Emergency Department (HOSPITAL_BASED_OUTPATIENT_CLINIC_OR_DEPARTMENT_OTHER): Admission: EM | Admit: 2018-11-12 | Payer: Self-pay | Source: Home / Self Care

## 2018-11-12 ENCOUNTER — Emergency Department (HOSPITAL_BASED_OUTPATIENT_CLINIC_OR_DEPARTMENT_OTHER)
Admission: EM | Admit: 2018-11-12 | Discharge: 2018-11-12 | Disposition: A | Discharge status: Home / Self Care | Payer: Medicaid Other | Attending: Emergency Medicine | Admitting: Emergency Medicine

## 2018-11-12 ENCOUNTER — Encounter (HOSPITAL_BASED_OUTPATIENT_CLINIC_OR_DEPARTMENT_OTHER): Payer: Self-pay | Admitting: Emergency Medicine

## 2018-11-12 ENCOUNTER — Other Ambulatory Visit: Payer: Self-pay

## 2018-11-12 DIAGNOSIS — N939 Abnormal uterine and vaginal bleeding, unspecified: Secondary | ICD-10-CM | POA: Insufficient documentation

## 2018-11-12 DIAGNOSIS — N898 Other specified noninflammatory disorders of vagina: Secondary | ICD-10-CM | POA: Insufficient documentation

## 2018-11-12 DIAGNOSIS — R109 Unspecified abdominal pain: Secondary | ICD-10-CM | POA: Insufficient documentation

## 2018-11-12 LAB — WET PREP, GENITAL
Clue Cells Wet Prep HPF POC: NONE SEEN
SPERM: NONE SEEN
Trich, Wet Prep: NONE SEEN
Yeast Wet Prep HPF POC: NONE SEEN

## 2018-11-12 LAB — URINALYSIS, ROUTINE W REFLEX MICROSCOPIC
BILIRUBIN URINE: NEGATIVE
Glucose, UA: NEGATIVE mg/dL
Ketones, ur: NEGATIVE mg/dL
LEUKOCYTES UA: NEGATIVE
NITRITE: NEGATIVE
PROTEIN: NEGATIVE mg/dL
Specific Gravity, Urine: 1.025 (ref 1.005–1.030)
pH: 6 (ref 5.0–8.0)

## 2018-11-12 LAB — URINALYSIS, MICROSCOPIC (REFLEX)

## 2018-11-12 LAB — PREGNANCY, URINE: Preg Test, Ur: NEGATIVE

## 2018-11-12 NOTE — ED Triage Notes (Signed)
Vaginal discharge x 3 days 

## 2018-11-12 NOTE — Discharge Instructions (Signed)
Your work-up today was overall reassuring.  There is no evidence of vaginal infection at this time.  Suspect your vaginal bleeding is due to the end of her menstrual cycle.  Please follow-up with your OB/GYN.  If any symptoms change or worsen, was return to the nearest emergency department.

## 2018-11-12 NOTE — ED Provider Notes (Signed)
[facility] EMERGENCY DEPARTMENT Provider Note   CSN: [id number] Arrival date & time: 11/12/18  4098     History   Chief Complaint Chief Complaint  Patient presents with  . Vaginal Discharge    HPI Katrina Wiley is a 37 y.o. female.  The history is provided by the patient and medical records. No language interpreter was used.  Vaginal Discharge   This is a new problem. The current episode started more than 2 days ago. The problem occurs constantly. The problem has not changed since onset.The discharge was white, malodorous and normal. Associated symptoms include abdominal pain (resolved). Pertinent negatives include no anorexia, no diaphoresis, no fever, no abdominal swelling, no constipation, no diarrhea, no nausea, no vomiting, no dysuria, no frequency, no genital burning and no perineal pain. She has tried nothing for the symptoms. The treatment provided no relief. Her past medical history does not include ectopic pregnancy.    Past Medical History:  Diagnosis Date  . Abnormal vaginal Pap smear    cryo done in Dr Verdell Carmine office  . Anemia   . Hyperlipidemia    no meds, diet controlled  . Preterm labor   . SVD (spontaneous vaginal delivery)    x 3    Patient Active Problem List   Diagnosis Date Noted  . Iron deficiency anemia 04/16/2018  . Microcytic anemia 04/16/2018    Past Surgical History:  Procedure Laterality Date  . DILATION AND CURETTAGE OF UTERUS    . DILITATION & CURRETTAGE/HYSTROSCOPY WITH HYDROTHERMAL ABLATION N/A 06/13/2017   Procedure: DILATATION & CURETTAGE/HYSTEROSCOPY WITH ATTEMPTED HYDROTHERMAL ABLATION;  Surgeon: Allie Bossier, MD;  Location: [facility];  Service: Gynecology;  Laterality: N/A;  . INCISION AND DRAINAGE PERIRECTAL ABSCESS    . TUBAL LIGATION    . WISDOM TOOTH EXTRACTION       OB History    Gravida  4   Para  2   Term  1   Preterm  1   AB  1   Living  2     SAB      TAB  1   Ectopic      Multiple        Live Births               Home Medications    Prior to Admission medications   Not on File    Family History No family history on file.  Social History Social History   Tobacco Use  . Smoking status: Never Smoker  . Smokeless tobacco: Never Used  Substance Use Topics  . Alcohol use: Yes    Alcohol/week: 0.0 standard drinks    Comment: occasional  . Drug use: No     Allergies   Patient has no known allergies.   Review of Systems Review of Systems  Constitutional: Negative for chills, diaphoresis, fatigue and fever.  HENT: Negative for congestion.   Respiratory: Negative for cough, chest tightness, shortness of breath and wheezing.   Cardiovascular: Negative for chest pain and palpitations.  Gastrointestinal: Positive for abdominal pain (resolved). Negative for anorexia, constipation, diarrhea, nausea and vomiting.  Genitourinary: Positive for vaginal bleeding and vaginal discharge. Negative for dysuria, flank pain, frequency and vaginal pain.  Musculoskeletal: Negative for back pain, neck pain and neck stiffness.  Skin: Negative for rash and wound.  Neurological: Negative for light-headedness and headaches.  All other systems reviewed and are negative.    Physical Exam Updated Vital Signs BP 132/78 (BP Location:  Left Arm)   Pulse 78   Temp 98.6 F (37 C)   Resp 16   Ht 5\' 3"  (1.6 m)   Wt 58 kg   LMP 10/31/2018 (Exact Date)   SpO2 100%   BMI 22.65 kg/m   Physical Exam Vitals signs and nursing note reviewed. Exam conducted with a chaperone present.  Constitutional:      General: She is not in acute distress.    Appearance: She is well-developed. She is not toxic-appearing or diaphoretic.  HENT:     Head: Normocephalic and atraumatic.     Right Ear: External ear normal.     Left Ear: External ear normal.     Nose: Nose normal.     Mouth/Throat:     Pharynx: No oropharyngeal exudate.  Eyes:     Conjunctiva/sclera: Conjunctivae normal.      Pupils: Pupils are equal, round, and reactive to light.  Neck:     Musculoskeletal: Normal range of motion and neck supple.  Pulmonary:     Effort: No respiratory distress.     Breath sounds: No stridor.  Abdominal:     General: There is no distension.     Tenderness: There is no abdominal tenderness. There is no rebound.  Genitourinary:    Exam position: Supine.     Labia:        Right: No tenderness.        Left: No tenderness.      Vagina: Vaginal discharge present. No tenderness.     Cervix: Discharge and cervical bleeding present.     Uterus: Normal. Not tender.      Adnexa:        Right: No tenderness.         Left: No tenderness.    Musculoskeletal:        General: No tenderness.  Skin:    General: Skin is warm.     Findings: No erythema or rash.  Neurological:     Mental Status: She is alert and oriented to person, place, and time.     Motor: No abnormal muscle tone.     Coordination: Coordination normal.     Deep Tendon Reflexes: Reflexes are normal and symmetric.      ED Treatments / Results  Labs (all labs ordered are listed, but only abnormal results are displayed) Labs Reviewed  WET PREP, GENITAL - Abnormal; Notable for the following components:      Result Value   WBC, Wet Prep HPF POC FEW (*)    All other components within normal limits  URINALYSIS, ROUTINE W REFLEX MICROSCOPIC - Abnormal; Notable for the following components:   Hgb urine dipstick TRACE (*)    All other components within normal limits  URINALYSIS, MICROSCOPIC (REFLEX) - Abnormal; Notable for the following components:   Bacteria, UA RARE (*)    All other components within normal limits  URINE CULTURE  PREGNANCY, URINE  GC/CHLAMYDIA PROBE AMP ([place]) NOT AT [facility]    EKG None  Radiology No results found.  Procedures Procedures (including critical care time)  Medications Ordered in ED Medications - No data to display   Initial Impression / Assessment and Plan / ED  Course  I have reviewed the triage vital signs and the nursing notes.  Pertinent labs & imaging results that were available during my care of the patient were reviewed by me and considered in my medical decision making (see chart for details).     NIKEChiquita N  Wiley is a 37 y.o. female with a past medical history significant for hyperlipidemia, chronic anemia, and prior tubal ligation who presents with vaginal discharge and vaginal bleeding and transient abdominal pain.  She reports that she is finishing up her menstrual cycle currently but reports she has had increase in bleeding and some clotting.  She reports has had a fishy, foul smell over the last several days as well as some white discharge.  She reports this feels similar when she had BV in the past.  She reports she had some abdominal cramping during her menstrual cycle several days ago but that has resolved.  She denies any other abdominal pain.  No lightheadedness, fevers, chills, chest pain or other symptoms.  No back pain or flank pain.  No recent pelvic injury.  Chart review shows that patient is O-, will give RhoGam if patient is pregnant in the setting of vaginal bleeding.  On exam, lungs clear.  Chest nontender.  Abdomen is nontender.  Pelvic exam was performed with chaperone and there was a small amount of vaginal discharge near the cervix.  There was also a small amount of bleeding near the office however the office was closed.  No tenderness is present.  No cervical motion tenderness or adnexal tenderness.  Exam otherwise unremarkable.  No laceration seen.  Suspect mild residual bleeding from her menstrual cycle.  Patient will have wet prep and GC/chlamydia.  Given lack of tenderness, doubt PID or cervicitis.  Patient will follow-up for results of GC/chlamydia test.  Patient will also follow-up with OB/GYN for further management.  Patient's pregnancy test was negative and urinalysis not showed some UTI.  Suspect blood was from  contamination from the vaginal bleeding.  Anticipate discharge home after wet prep.     Wet prep shows no clue cells.  Patient will be discharged home and will follow-up for results of GC/chlamydia testing.  Patient will follow-up with OB/GYN and PCP.  Suspect discomfort and bleeding from the end of her menstrual cycle.  No evidence of infection at this time.  Patient discharged in good condition.   Final Clinical Impressions(s) / ED Diagnoses   Final diagnoses:  Vaginal discharge  Vaginal bleeding    ED Discharge Orders    None      Clinical Impression: 1. Vaginal discharge   2. Vaginal bleeding     Disposition: Discharge  Condition: Good  I have discussed the results, Dx and Tx plan with the pt(& family if present). He/she/they expressed understanding and agree(s) with the plan. Discharge instructions discussed at great length. Strict return precautions discussed and pt &/or family have verbalized understanding of the instructions. No further questions at time of discharge.    New Prescriptions   No medications on file    Follow Up: [name], MD [address] [place] [address] [phone]     [facility] [address] [id number] mc [address] [place] [address] [phone]    [facility] [address] [place] [address] [phone] Schedule an appointment as soon as possible for a visit         Islay Polanco, Canary Brim, MD 11/12/18 [phone]

## 2018-11-13 ENCOUNTER — Inpatient Hospital Stay (HOSPITAL_COMMUNITY)
Admission: AD | Admit: 2018-11-13 | Discharge: 2018-11-13 | Disposition: A | Discharge status: Home / Self Care | Payer: Medicaid Other | Source: Ambulatory Visit | Attending: Obstetrics and Gynecology | Admitting: Obstetrics and Gynecology

## 2018-11-13 ENCOUNTER — Encounter (HOSPITAL_COMMUNITY): Payer: Self-pay

## 2018-11-13 DIAGNOSIS — N939 Abnormal uterine and vaginal bleeding, unspecified: Secondary | ICD-10-CM

## 2018-11-13 DIAGNOSIS — N898 Other specified noninflammatory disorders of vagina: Secondary | ICD-10-CM

## 2018-11-13 HISTORY — DX: Sickle-cell trait: D57.3

## 2018-11-13 LAB — CBC
HCT: 37.1 % (ref 36.0–46.0)
Hemoglobin: 12.4 g/dL (ref 12.0–15.0)
MCH: 26.8 pg (ref 26.0–34.0)
MCHC: 33.4 g/dL (ref 30.0–36.0)
MCV: 80.1 fL (ref 80.0–100.0)
Platelets: 194 10*3/uL (ref 150–400)
RBC: 4.63 MIL/uL (ref 3.87–5.11)
RDW: 13.3 % (ref 11.5–15.5)
WBC: 5.6 10*3/uL (ref 4.0–10.5)
nRBC: 0 % (ref 0.0–0.2)

## 2018-11-13 LAB — URINALYSIS, ROUTINE W REFLEX MICROSCOPIC
Bilirubin Urine: NEGATIVE
Glucose, UA: NEGATIVE mg/dL
Ketones, ur: NEGATIVE mg/dL
Leukocytes, UA: NEGATIVE
Nitrite: NEGATIVE
PH: 5 (ref 5.0–8.0)
Protein, ur: NEGATIVE mg/dL
Specific Gravity, Urine: 1.021 (ref 1.005–1.030)

## 2018-11-13 LAB — GC/CHLAMYDIA PROBE AMP (~~LOC~~) NOT AT ARMC
Chlamydia: NEGATIVE
Neisseria Gonorrhea: NEGATIVE

## 2018-11-13 LAB — URINE CULTURE: Culture: NO GROWTH

## 2018-11-13 LAB — HCG, SERUM, QUALITATIVE: Preg, Serum: NEGATIVE

## 2018-11-13 NOTE — Discharge Instructions (Signed)
Abnormal Uterine Bleeding  Abnormal uterine bleeding means bleeding more than usual from your uterus. It can include:   Bleeding between periods.   Bleeding after sex.   Bleeding that is heavier than normal.   Periods that last longer than usual.   Bleeding after you have stopped having your period (menopause).  There are many problems that may cause this. You should see a doctor for any kind of bleeding that is not normal. Treatment depends on the cause of the bleeding.  Follow these instructions at home:   Watch your condition for any changes.   Do not use tampons, douche, or have sex, if your doctor tells you not to.   Change your pads often.   Get regular well-woman exams. Make sure they include a pelvic exam and cervical cancer screening.   Keep all follow-up visits as told by your doctor. This is important.  Contact a doctor if:   The bleeding lasts more than one week.   You feel dizzy at times.   You feel like you are going to throw up (nauseous).   You throw up.  Get help right away if:   You pass out.   You have to change pads every hour.   You have belly (abdominal) pain.   You have a fever.   You get sweaty.   You get weak.   You passing large blood clots from your vagina.  Summary   Abnormal uterine bleeding means bleeding more than usual from your uterus.   There are many problems that may cause this. You should see a doctor for any kind of bleeding that is not normal.   Treatment depends on the cause of the bleeding.  This information is not intended to replace advice given to you by your health care provider. Make sure you discuss any questions you have with your health care provider.  Document Released: 09/09/2009 Document Revised: 11/06/2016 Document Reviewed: 11/06/2016  Elsevier Interactive Patient Education  2019 [employer] Inc[employer]

## 2018-11-13 NOTE — MAU Note (Signed)
[facility].  States she has been bleeding since 12/6.  Noticed some gray clumps in the bleeding so she feels like it could have been a miscarriage.  Bleeding has slowed to just spotting now.  Had a tubal in 2013.  Usually has a normal cycle every month until this month.  Reports no new sexual partners.  Last intercourse on Monday.  Abdominal cramping has also improved.  Hasn't taken anything for the pain.  Was told to follow up with Dr. Clearance CootsHarper but her insurance doesn't start until next month.

## 2018-11-13 NOTE — MAU Provider Note (Signed)
Chief Complaint:  Vaginal Bleeding   First Provider Initiated Contact with Patient 11/13/18 2132     [facility]  HPI: Katrina Wiley is a 37 y.o. [id number] who presents to maternity admissions reporting passage of grey tissue the other day. Had a full evaluation in ED last night.  S/P BTL and Ablation. Still has periods.  NO pain.  Wants bloodwork and US to make sure there are no problems.  Is concerned it was pregnancy tissue.  . She reports vaginal bleeding, but no vaginal itching/burning, urinary symptoms, h/a, dizziness, n/v, or fever/chills.    HPI RN Note: [facility] for vaginal bleeding.  States she has been bleeding since 12/6.  Noticed some gray clumps in the bleeding so she feels like it could have been a miscarriage.  Bleeding has slowed to just spotting now.  Had a tubal in 2013.  Usually has a normal cycle every month until this month.  Reports no new sexual partners.  Last intercourse on Monday.  Abdominal cramping has also improved.  Hasn't taken anything for the pain.  Was told to follow up with Dr. Clearance CootsHarper but her insurance doesn't start until next month.    Past Medical History: Past Medical History:  Diagnosis Date  . Abnormal vaginal Pap smear    cryo done in Dr Verdell CarmineHarper's office  . Anemia   . Hyperlipidemia    no meds, diet controlled  . Preterm labor   . Sickle cell trait (HCC)   . SVD (spontaneous vaginal delivery)    x 3    Past obstetric history: OB History  Gravida Para Term Preterm AB Living  4 3 2 1 1 2   SAB TAB Ectopic Multiple Live Births    1          # Outcome Date GA Lbr Len/2nd Weight Sex Delivery Anes PTL Lv  4 Term      Vag-Spont        Birth Comments: System Generated. Please review and update pregnancy details.  3 TAB           2 Preterm      Vag-Spont     1 Term      Vag-Spont       Past Surgical History: Past Surgical History:  Procedure Laterality Date  . DILATION AND CURETTAGE OF UTERUS    . DILITATION &  CURRETTAGE/HYSTROSCOPY WITH HYDROTHERMAL ABLATION N/A 06/13/2017   Procedure: DILATATION & CURETTAGE/HYSTEROSCOPY WITH ATTEMPTED HYDROTHERMAL ABLATION;  Surgeon: Allie Bossierove, Myra C, MD;  Location: [facility];  Service: Gynecology;  Laterality: N/A;  . INCISION AND DRAINAGE PERIRECTAL ABSCESS    . TUBAL LIGATION    . WISDOM TOOTH EXTRACTION      Family History: No family history on file.  Social History: Social History   Tobacco Use  . Smoking status: Never Smoker  . Smokeless tobacco: Never Used  Substance Use Topics  . Alcohol use: Yes    Alcohol/week: 0.0 standard drinks    Comment: occasional  . Drug use: No    Allergies: No Known Allergies  Meds:  No medications prior to admission.    I have reviewed patient's Past Medical Hx, Surgical Hx, Family Hx, Social Hx, medications and allergies.  ROS:  Review of Systems  Constitutional: Negative for chills and fever.  Respiratory: Negative for shortness of breath.   Gastrointestinal: Positive for abdominal pain (cramping). Negative for constipation and diarrhea.  Genitourinary: Positive for vaginal bleeding and vaginal discharge.  Other systems negative    Physical Exam   Patient Vitals for the past 24 hrs:  BP Temp Pulse Resp SpO2 Height Weight  11/13/18 2033 133/75 98.9 F (37.2 C) 78 17 100 % 5\' 3"  (1.6 m) 57.8 kg   Constitutional: Well-developed, well-nourished female in no acute distress.  Cardiovascular: normal rate and rhythm Respiratory: normal effort, no distress.  GI: Abd soft, non-tender.  Nondistended.  No rebound, No guarding.    MS: Extremities nontender, no edema, normal ROM Neurologic: Alert and oriented x 4.   Grossly nonfocal. GU: Neg CVAT. Skin:  Warm and Dry Psych:  Affect appropriate.  PELVIC EXAM: deferred, just done last night.  Pt states has NO pain and only wants blood test for pregnancy.   Labs: Results for orders placed or performed during the hospital encounter of 11/13/18 (from the past 24  hour(s))  Urinalysis, Routine w reflex microscopic     Status: Abnormal   Collection Time: 11/13/18  8:41 PM  Result Value Ref Range   Color, Urine YELLOW YELLOW   APPearance HAZY (A) CLEAR   Specific Gravity, Urine 1.021 1.005 - 1.030   pH 5.0 5.0 - 8.0   Glucose, UA NEGATIVE NEGATIVE mg/dL   Hgb urine dipstick LARGE (A) NEGATIVE   Bilirubin Urine NEGATIVE NEGATIVE   Ketones, ur NEGATIVE NEGATIVE mg/dL   Protein, ur NEGATIVE NEGATIVE mg/dL   Nitrite NEGATIVE NEGATIVE   Leukocytes, UA NEGATIVE NEGATIVE   RBC / HPF 0-5 0 - 5 RBC/hpf   WBC, UA 0-5 0 - 5 WBC/hpf   Bacteria, UA RARE (A) NONE SEEN   Squamous Epithelial / LPF 11-20 0 - 5   Mucus PRESENT   hCG, serum, qualitative     Status: None   Collection Time: 11/13/18  9:52 PM  Result Value Ref Range   Preg, Serum NEGATIVE NEGATIVE  CBC     Status: None   Collection Time: 11/13/18  9:52 PM  Result Value Ref Range   WBC 5.6 4.0 - 10.5 K/uL   RBC 4.63 3.87 - 5.11 MIL/uL   Hemoglobin 12.4 12.0 - 15.0 g/dL   HCT 09.837.1 11.936.0 - 14.746.0 %   MCV 80.1 80.0 - 100.0 fL   MCH 26.8 26.0 - 34.0 pg   MCHC 33.4 30.0 - 36.0 g/dL   RDW 82.913.3 56.211.5 - 13.015.5 %   Platelets 194 150 - 400 K/uL   nRBC 0.0 0.0 - 0.2 %    Imaging:  No results found.  MAU Course/MDM: I have ordered labs as follows:  See above   GC/Chlamydia from ED are negative Imaging ordered: none Results reviewed. Stressed that this is conclusive evidence she is not pregnant. Also that BTL and ablation make chances of that very unlikely.  Discussed grey tissue was likely decidual cast.     Pt stable at time of discharge.  Assessment: Vaginal bleeding, likely menstrual,  with passage of tissue Not pregnant No evidence of infection, no leukocytosis  Plan: Discharge home Recommend Follow up in office if she has further problems  Encouraged to return here or to other Urgent Care/ED if she develops worsening of symptoms, increase in pain, fever, or other concerning symptoms.    Wynelle BourgeoisMarie Latavius Capizzi CNM, MSN Certified Nurse-Midwife 11/13/2018 9:33 PM

## 2018-11-20 ENCOUNTER — Other Ambulatory Visit: Payer: Self-pay | Admitting: Advanced Practice Midwife

## 2018-11-20 ENCOUNTER — Other Ambulatory Visit (HOSPITAL_COMMUNITY): Payer: Self-pay | Admitting: Advanced Practice Midwife

## 2018-11-20 DIAGNOSIS — R102 Pelvic and perineal pain: Secondary | ICD-10-CM

## 2018-11-20 NOTE — Progress Notes (Signed)
US TV ordere added

## 2018-12-05 ENCOUNTER — Ambulatory Visit: Payer: Medicaid Other | Admitting: Obstetrics

## 2018-12-05 ENCOUNTER — Ambulatory Visit (HOSPITAL_COMMUNITY): Payer: Medicaid Other

## 2019-01-06 ENCOUNTER — Other Ambulatory Visit: Payer: Self-pay | Admitting: *Deleted

## 2019-01-06 DIAGNOSIS — N760 Acute vaginitis: Secondary | ICD-10-CM

## 2019-01-06 DIAGNOSIS — B9689 Other specified bacterial agents as the cause of diseases classified elsewhere: Secondary | ICD-10-CM

## 2019-01-06 MED ORDER — METRONIDAZOLE 500 MG PO TABS: 500.0000 mg | ORAL_TABLET | Freq: Two times a day (BID) | ORAL | 0 refills | Status: DC

## 2019-01-06 NOTE — Progress Notes (Signed)
Pt called to office with symptoms of BV.  Treatment sent in per protocol, pt prefers oral medication.  Flagyl 500mg  sent to pharmacy.

## 2019-01-09 ENCOUNTER — Encounter: Payer: Self-pay | Admitting: Obstetrics

## 2019-01-09 ENCOUNTER — Ambulatory Visit (HOSPITAL_COMMUNITY): Payer: Medicaid Other

## 2019-01-09 ENCOUNTER — Ambulatory Visit: Payer: Medicaid Other | Admitting: Obstetrics

## 2019-01-09 VITALS — BP 116/81 | HR 87 | Wt 124.4 lb

## 2019-01-09 DIAGNOSIS — N939 Abnormal uterine and vaginal bleeding, unspecified: Secondary | ICD-10-CM

## 2019-01-09 NOTE — Progress Notes (Signed)
Patient ID: Katrina Wiley, female   DOB: 1981-05-10, 38 y.o.   MRN: [id number]  No chief complaint on file.   HPI Katrina Wiley is a 39 y.o. female.  History of prolonged vaginal bleeding like a period but with clots for 2 weeks during December.  No further AUB in January or February. HPI  Past Medical History:  Diagnosis Date  . Abnormal vaginal Pap smear    cryo done in Dr Verdell Carmine office  . Anemia   . Hyperlipidemia    no meds, diet controlled  . Preterm labor   . Sickle cell trait (HCC)   . SVD (spontaneous vaginal delivery)    x 3    Past Surgical History:  Procedure Laterality Date  . DILATION AND CURETTAGE OF UTERUS    . DILITATION & CURRETTAGE/HYSTROSCOPY WITH HYDROTHERMAL ABLATION N/A 06/13/2017   Procedure: DILATATION & CURETTAGE/HYSTEROSCOPY WITH ATTEMPTED HYDROTHERMAL ABLATION;  Surgeon: Allie Bossier, MD;  Location: [facility];  Service: Gynecology;  Laterality: N/A;  . INCISION AND DRAINAGE PERIRECTAL ABSCESS    . TUBAL LIGATION    . WISDOM TOOTH EXTRACTION      History reviewed. No pertinent family history.  Social History Social History   Tobacco Use  . Smoking status: Never Smoker  . Smokeless tobacco: Never Used  Substance Use Topics  . Alcohol use: Yes    Alcohol/week: 0.0 standard drinks    Comment: occasional  . Drug use: No    No Known Allergies  Current Outpatient Medications  Medication Sig Dispense Refill  . metroNIDAZOLE (FLAGYL) 500 MG tablet Take 1 tablet (500 mg total) by mouth 2 (two) times daily. 14 tablet 0   No current facility-administered medications for this visit.     Review of Systems Review of Systems Constitutional: negative for fatigue and weight loss Respiratory: negative for cough and wheezing Cardiovascular: negative for chest pain, fatigue and palpitations Gastrointestinal: negative for abdominal pain and change in bowel habits Genitourinary:negative Integument/breast: negative for nipple  discharge Musculoskeletal:negative for myalgias Neurological: negative for gait problems and tremors Behavioral/Psych: negative for abusive relationship, depression Endocrine: negative for temperature intolerance      Blood pressure 116/81, pulse 87, weight 124 lb 6.4 oz (56.4 kg), last menstrual period 01/01/2019.  Physical Exam Physical Exam:  Deferred   >50% of 15 min visit spent on counseling and coordination of care.   Data Reviewed Labs:  WNL's  Assessment     1. Abnormal uterine bleeding (AUB) - Ultrasound scheduled for today    Plan    Follow up prn  No orders of the defined types were placed in this encounter.  No orders of the defined types were placed in this encounter.   Brock Bad MD 01-09-2019

## 2019-01-09 NOTE — Progress Notes (Signed)
Pt presents to follow up after pelvic u/s for abnormal VB.  Pt denies VB today. U/S is not scheduled until today at 3 pm.

## 2019-01-23 ENCOUNTER — Ambulatory Visit (HOSPITAL_COMMUNITY)
Admission: RE | Admit: 2019-01-23 | Discharge: 2019-01-23 | Disposition: A | Discharge status: Home / Self Care | Payer: Medicaid Other | Source: Ambulatory Visit | Attending: Advanced Practice Midwife | Admitting: Advanced Practice Midwife

## 2019-01-23 DIAGNOSIS — N939 Abnormal uterine and vaginal bleeding, unspecified: Secondary | ICD-10-CM | POA: Diagnosis present

## 2019-01-23 DIAGNOSIS — N898 Other specified noninflammatory disorders of vagina: Secondary | ICD-10-CM | POA: Diagnosis present

## 2019-04-02 ENCOUNTER — Other Ambulatory Visit: Payer: Self-pay | Admitting: *Deleted

## 2019-04-02 DIAGNOSIS — N760 Acute vaginitis: Secondary | ICD-10-CM

## 2019-04-02 DIAGNOSIS — B9689 Other specified bacterial agents as the cause of diseases classified elsewhere: Secondary | ICD-10-CM

## 2019-04-02 MED ORDER — METRONIDAZOLE 500 MG PO TABS: 500.0000 mg | ORAL_TABLET | Freq: Two times a day (BID) | ORAL | 0 refills | Status: DC

## 2019-04-02 NOTE — Progress Notes (Signed)
Pt called to office for tx of BV.  Flagyl sent to pharmacy per protocol.

## 2019-07-01 ENCOUNTER — Telehealth: Payer: Self-pay

## 2019-07-01 NOTE — Telephone Encounter (Signed)
Pt called and reports that she is still having prolonged periods. In Dr. Jacelyn Grip last note he wanted her to have a follow up appointment after her last [place] to discuss further options for her. I advise pt she will need to make that follow up appt with Dr. Jodi Mourning to go over everything, pt verbalized understanding.

## 2019-07-02 ENCOUNTER — Telehealth: Payer: Medicaid Other | Admitting: Obstetrics

## 2019-07-09 ENCOUNTER — Telehealth: Payer: Self-pay | Admitting: Obstetrics

## 2019-11-06 ENCOUNTER — Ambulatory Visit: Payer: Medicaid Other | Admitting: Obstetrics

## 2019-11-16 ENCOUNTER — Other Ambulatory Visit: Payer: Self-pay

## 2019-11-16 ENCOUNTER — Ambulatory Visit: Payer: Medicaid Other | Admitting: Obstetrics

## 2019-11-16 DIAGNOSIS — B9689 Other specified bacterial agents as the cause of diseases classified elsewhere: Secondary | ICD-10-CM

## 2019-11-16 DIAGNOSIS — N760 Acute vaginitis: Secondary | ICD-10-CM

## 2019-11-16 NOTE — Progress Notes (Signed)
Need to confirm which pharmacy pt would like to use for BV RX.

## 2019-11-17 MED ORDER — METRONIDAZOLE 500 MG PO TABS: 500.0000 mg | ORAL_TABLET | Freq: Two times a day (BID) | ORAL | 0 refills | Status: DC

## 2019-11-18 ENCOUNTER — Ambulatory Visit: Payer: Medicaid Other | Attending: Internal Medicine

## 2019-11-18 DIAGNOSIS — Z20822 Contact with and (suspected) exposure to covid-19: Secondary | ICD-10-CM

## 2019-11-19 LAB — NOVEL CORONAVIRUS, NAA: SARS-CoV-2, NAA: NOT DETECTED

## 2019-12-04 ENCOUNTER — Other Ambulatory Visit (HOSPITAL_COMMUNITY)
Admission: RE | Admit: 2019-12-04 | Discharge: 2019-12-04 | Disposition: A | Discharge status: Home / Self Care | Payer: Medicaid Other | Source: Ambulatory Visit | Attending: Obstetrics | Admitting: Obstetrics

## 2019-12-04 ENCOUNTER — Ambulatory Visit: Payer: Medicaid Other | Admitting: Obstetrics

## 2019-12-04 ENCOUNTER — Encounter: Payer: Self-pay | Admitting: Obstetrics

## 2019-12-04 ENCOUNTER — Other Ambulatory Visit: Payer: Self-pay

## 2019-12-04 VITALS — BP 123/82 | HR 87

## 2019-12-04 DIAGNOSIS — N946 Dysmenorrhea, unspecified: Secondary | ICD-10-CM

## 2019-12-04 DIAGNOSIS — N898 Other specified noninflammatory disorders of vagina: Secondary | ICD-10-CM | POA: Insufficient documentation

## 2019-12-04 DIAGNOSIS — Z113 Encounter for screening for infections with a predominantly sexual mode of transmission: Secondary | ICD-10-CM

## 2019-12-04 DIAGNOSIS — Z0000 Encounter for general adult medical examination without abnormal findings: Secondary | ICD-10-CM

## 2019-12-04 DIAGNOSIS — Z01419 Encounter for gynecological examination (general) (routine) without abnormal findings: Secondary | ICD-10-CM | POA: Insufficient documentation

## 2019-12-04 DIAGNOSIS — D508 Other iron deficiency anemias: Secondary | ICD-10-CM

## 2019-12-04 MED ORDER — FOLIC ACID 1 MG PO TABS: 2.0000 mg | ORAL_TABLET | Freq: Every day | ORAL | 11 refills | Status: DC

## 2019-12-04 MED ORDER — IBUPROFEN 800 MG PO TABS: 800.0000 mg | ORAL_TABLET | Freq: Three times a day (TID) | ORAL | 5 refills | Status: DC | PRN

## 2019-12-04 NOTE — Progress Notes (Signed)
Subjective:        Katrina Wiley is a 39 y.o. female here for a routine exam.  Current complaints: None.    Personal health questionnaire:  Is patient [name], have a family history of breast and/or ovarian cancer: no Is there a family history of uterine cancer diagnosed at age < 27, gastrointestinal cancer, urinary tract cancer, family member who is a Field seismologist syndrome-associated carrier: no Is the patient overweight and hypertensive, family history of diabetes, personal history of gestational diabetes, preeclampsia or PCOS: no Is patient over 60, have PCOS,  family history of premature CHD under age 62, diabetes, smoke, have hypertension or peripheral artery disease:  no At any time, has a partner hit, kicked or otherwise hurt or frightened you?: no Over the past 2 weeks, have you felt down, depressed or hopeless?: no Over the past 2 weeks, have you felt little interest or pleasure in doing things?:no   Gynecologic History No LMP recorded. Contraception: tubal ligation Last Pap: 04-02-2018. Results were: normal Last mammogram: n/a. Results were: n/a  Obstetric History OB History  Gravida Para Term Preterm AB Living  4 3 2 1 1 2   SAB TAB Ectopic Multiple Live Births    1          # Outcome Date GA Lbr Len/2nd Weight Sex Delivery Anes PTL Lv  4 Term      Vag-Spont        Birth Comments: System Generated. Please review and update pregnancy details.  3 TAB           2 Preterm      Vag-Spont     1 Term      Vag-Spont       Past Medical History:  Diagnosis Date  . Abnormal vaginal Pap smear    cryo done in Dr Jacelyn Grip office  . Anemia   . Hyperlipidemia    no meds, diet controlled  . Preterm labor   . Sickle cell trait ([place])   . SVD (spontaneous vaginal delivery)    x 3    Past Surgical History:  Procedure Laterality Date  . DILATION AND CURETTAGE OF UTERUS    . DILITATION & CURRETTAGE/HYSTROSCOPY WITH HYDROTHERMAL ABLATION N/A 06/13/2017   Procedure:  DILATATION & CURETTAGE/HYSTEROSCOPY WITH ATTEMPTED HYDROTHERMAL ABLATION;  Surgeon: Emily Filbert, MD;  Location: [facility];  Service: Gynecology;  Laterality: N/A;  . INCISION AND DRAINAGE PERIRECTAL ABSCESS    . TUBAL LIGATION    . WISDOM TOOTH EXTRACTION       Current Outpatient Medications:  .  folic acid (FOLVITE) 1 MG tablet, Take 2 tablets (2 mg total) by mouth daily., Disp: 60 tablet, Rfl: 11 .  ibuprofen (ADVIL) 800 MG tablet, Take 1 tablet (800 mg total) by mouth every 8 (eight) hours as needed., Disp: 30 tablet, Rfl: 5 No Known Allergies  Social History   Tobacco Use  . Smoking status: Never Smoker  . Smokeless tobacco: Never Used  Substance Use Topics  . Alcohol use: Yes    Alcohol/week: 0.0 standard drinks    Comment: occasional    History reviewed. No pertinent family history.    Review of Systems  Constitutional: negative for fatigue and weight loss Respiratory: negative for cough and wheezing Cardiovascular: negative for chest pain, fatigue and palpitations Gastrointestinal: negative for abdominal pain and change in bowel habits Musculoskeletal:negative for myalgias Neurological: negative for gait problems and tremors Behavioral/Psych: negative for abusive relationship, depression Endocrine: negative for  temperature intolerance    Genitourinary:negative for abnormal menstrual periods, genital lesions, hot flashes, sexual problems and vaginal discharge Integument/breast: negative for breast lump, breast tenderness, nipple discharge and skin lesion(s)    Objective:       BP 123/82   Pulse 87  General:   alert  Skin:   no rash or abnormalities  Lungs:   clear to auscultation bilaterally  Heart:   regular rate and rhythm, S1, S2 normal, no murmur, click, rub or gallop  Breasts:   normal without suspicious masses, skin or nipple changes or axillary nodes  Abdomen:  normal findings: no organomegaly, soft, non-tender and no hernia  Pelvis:  External genitalia:  normal general appearance Urinary system: urethral meatus normal and bladder without fullness, nontender Vaginal: normal without tenderness, induration or masses Cervix: normal appearance Adnexa: normal bimanual exam Uterus: anteverted and non-tender, normal size   Lab Review Urine pregnancy test Labs reviewed yes Radiologic studies reviewed no  50% of 25 min visit spent on counseling and coordination of care.   Assessment:     1. Encounter for routine gynecological examination with Papanicolaou smear of cervix Rx: - Cytology - PAP( [facility])  2. Vaginal discharge Rx: - Cervicovaginal ancillary only( [place])  3. Screening for STD (sexually transmitted disease) Rx: - HIV antibody - Hepatitis B surface antigen - RPR - Hepatitis C antibody  4. Dysmenorrhea Rx: - ibuprofen (ADVIL) 800 MG tablet; Take 1 tablet (800 mg total) by mouth every 8 (eight) hours as needed.  Dispense: 30 tablet; Refill: 5  5. Iron deficiency anemia secondary to inadequate dietary iron intake Rx: - folic acid (FOLVITE) 1 MG tablet; Take 2 tablets (2 mg total) by mouth daily.  Dispense: 60 tablet; Refill: 11   6. Sickle Cell Trait   Plan:    Education reviewed: calcium supplements, depression evaluation, low fat, low cholesterol diet, safe sex/STD prevention, self breast exams and weight bearing exercise. Contraception: tubal ligation. Follow up in: 1 year.   Meds ordered this encounter  Medications  . folic acid (FOLVITE) 1 MG tablet    Sig: Take 2 tablets (2 mg total) by mouth daily.    Dispense:  60 tablet    Refill:  11  . ibuprofen (ADVIL) 800 MG tablet    Sig: Take 1 tablet (800 mg total) by mouth every 8 (eight) hours as needed.    Dispense:  30 tablet    Refill:  5   Orders Placed This Encounter  Procedures  . HIV antibody  . Hepatitis B surface antigen  . RPR  . Hepatitis C antibody     Brock Bad, MD 12/04/2019 9:08 AM

## 2019-12-05 LAB — HIV ANTIBODY (ROUTINE TESTING W REFLEX): HIV Screen 4th Generation wRfx: NONREACTIVE

## 2019-12-05 LAB — HEPATITIS C ANTIBODY: Hep C Virus Ab: <0.1 s/co ratio (ref 0.0–0.9)

## 2019-12-05 LAB — HEPATITIS B SURFACE ANTIGEN: Hepatitis B Surface Ag: NEGATIVE

## 2019-12-05 LAB — RPR: RPR Ser Ql: NONREACTIVE

## 2019-12-07 LAB — CERVICOVAGINAL ANCILLARY ONLY
Bacterial Vaginitis (gardnerella): POSITIVE — AB
Candida Glabrata: NEGATIVE
Candida Vaginitis: NEGATIVE
Chlamydia: NEGATIVE
Comment: NEGATIVE
Comment: NEGATIVE
Comment: NEGATIVE
Comment: NEGATIVE
Comment: NEGATIVE
Comment: NORMAL
Neisseria Gonorrhea: NEGATIVE
Trichomonas: NEGATIVE

## 2019-12-08 ENCOUNTER — Other Ambulatory Visit: Payer: Self-pay | Admitting: Obstetrics

## 2019-12-08 DIAGNOSIS — B9689 Other specified bacterial agents as the cause of diseases classified elsewhere: Secondary | ICD-10-CM

## 2019-12-08 DIAGNOSIS — N760 Acute vaginitis: Secondary | ICD-10-CM

## 2019-12-08 LAB — CYTOLOGY - PAP
Comment: NEGATIVE
Diagnosis: NEGATIVE
High risk HPV: NEGATIVE

## 2019-12-08 MED ORDER — SOLOSEC 2 G PO PACK: 1.0000 | PACK | Freq: Once | ORAL | 2 refills | Status: AC

## 2019-12-16 ENCOUNTER — Ambulatory Visit: Payer: Medicaid Other | Attending: Internal Medicine

## 2019-12-16 DIAGNOSIS — Z20822 Contact with and (suspected) exposure to covid-19: Secondary | ICD-10-CM

## 2019-12-17 LAB — NOVEL CORONAVIRUS, NAA: SARS-CoV-2, NAA: DETECTED — AB

## 2019-12-17 NOTE — Progress Notes (Signed)
Your test for COVID-19 was positive ("detected"), meaning that you were infected with the novel coronavirus and could give the germ to others.    Please continue isolation at home, for at least 10 days since the start of your fever/cough/breathlessness and until you have had 24 hours without fever (without taking a fever reducer) and with any cough/breathlessness improving. Use over-the-counter medications for symptoms.  If you have had no symptoms, but were exposed to someone who was positive for COVID-19, you will need to quarantine and self-isolate for 14 days from the date of exposure.    Please continue good preventive care measures, including:  frequent hand-washing, avoid touching your face, cover coughs/sneezes, stay out of crowds and keep a 6 foot distance from others.  Clean hard surfaces touched frequently with disinfectant cleaning products.   Please check in with your primary care provider about your positive test result.  Go to the nearest urgent care or ED for assessment if you have severe breathlessness or severe weakness/fatigue (ex needing new help getting out of bed or to the bathroom).  Members of your household will also need to quarantine for 14 days from the date of your positive test.You may also be contacted by the health department for follow up. Please call [facility] at [phone] if you have any questions or concerns.     

## 2019-12-18 ENCOUNTER — Telehealth (HOSPITAL_COMMUNITY): Payer: Self-pay | Admitting: Physician Assistant

## 2019-12-18 NOTE — Telephone Encounter (Signed)
Called to discuss with Katrina Wiley about Covid symptoms and the use of bamlanivimab, a monoclonal antibody infusion for those with mild to moderate Covid symptoms and at a high risk of hospitalization.     Pt is not qualified for this infusion due to lack of identified risk factors and co-morbid conditions.  Symptoms reviewed as well as criteria for ending isolation.  Symptoms reviewed that would warrant ED/Hospital evaluation as well should her condition worsen. Preventative practices reviewed. Patient verbalized understanding.  Manson Passey, PA - C

## 2019-12-24 ENCOUNTER — Other Ambulatory Visit: Payer: Medicaid Other

## 2019-12-29 ENCOUNTER — Other Ambulatory Visit: Payer: Medicaid Other

## 2019-12-29 ENCOUNTER — Ambulatory Visit: Payer: Medicaid Other | Attending: Internal Medicine

## 2019-12-29 DIAGNOSIS — Z20822 Contact with and (suspected) exposure to covid-19: Secondary | ICD-10-CM

## 2019-12-30 ENCOUNTER — Other Ambulatory Visit: Payer: Medicaid Other

## 2019-12-30 LAB — NOVEL CORONAVIRUS, NAA: SARS-CoV-2, NAA: DETECTED — AB

## 2020-05-26 ENCOUNTER — Encounter: Payer: Self-pay | Admitting: Obstetrics

## 2020-05-26 ENCOUNTER — Telehealth (INDEPENDENT_AMBULATORY_CARE_PROVIDER_SITE_OTHER): Payer: Medicaid Other | Admitting: Obstetrics

## 2020-05-26 DIAGNOSIS — Z30011 Encounter for initial prescription of contraceptive pills: Secondary | ICD-10-CM | POA: Diagnosis not present

## 2020-05-26 DIAGNOSIS — Z9889 Other specified postprocedural states: Secondary | ICD-10-CM | POA: Diagnosis not present

## 2020-05-26 DIAGNOSIS — N926 Irregular menstruation, unspecified: Secondary | ICD-10-CM | POA: Diagnosis not present

## 2020-05-26 DIAGNOSIS — Z9851 Tubal ligation status: Secondary | ICD-10-CM

## 2020-05-26 MED ORDER — NORETHIN ACE-ETH ESTRAD-FE 1-20 MG-MCG(24) PO TABS: 1.0000 | ORAL_TABLET | Freq: Every day | ORAL | 11 refills | Status: DC

## 2020-05-26 NOTE — Progress Notes (Addendum)
GYNECOLOGY VIRTUAL VISIT ENCOUNTER NOTE  Provider location: [facility] [place]   I connected with Katrina Wiley on 05/26/20 at 11:15 AM EDT by MyChart Video Encounter at home and verified that I am speaking with the correct person using two identifiers.   I discussed the limitations, risks, security and privacy concerns of performing an evaluation and management service virtually and the availability of in person appointments. I also discussed with the patient that there may be a patient responsible charge related to this service. The patient expressed understanding and agreed to proceed.   History:  Katrina Wiley is a 39 y.o. [phone] female being evaluated today for irregular menstrual cycles.  She has a history of a tubal ligation after last delivery and had heavy painful and prolonged periods after her tubal.  She subsequently underwent Endometrial Ablation                   ( HydroAblation ) in 2018.  Her periods have continued to be heavy, irregular and 5-6 day duration. She denies any abnormal vaginal discharge, pelvic pain or other concerns.       Past Medical History:  Diagnosis Date  . Abnormal vaginal Pap smear    cryo done in Dr Verdell Carmine office  . Anemia   . Hyperlipidemia    no meds, diet controlled  . Preterm labor   . Sickle cell trait ([facility])   . SVD (spontaneous vaginal delivery)    x 3   Past Surgical History:  Procedure Laterality Date  . DILATION AND CURETTAGE OF UTERUS    . DILITATION & CURRETTAGE/HYSTROSCOPY WITH HYDROTHERMAL ABLATION N/A 06/13/2017   Procedure: DILATATION & CURETTAGE/HYSTEROSCOPY WITH ATTEMPTED HYDROTHERMAL ABLATION;  Surgeon: Allie Bossier, MD;  Location: [facility];  Service: Gynecology;  Laterality: N/A;  . INCISION AND DRAINAGE PERIRECTAL ABSCESS    . TUBAL LIGATION    . WISDOM TOOTH EXTRACTION     The following portions of the patient's history were reviewed and updated as appropriate: allergies, current  medications, past family history, past medical history, past social history, past surgical history and problem list.   Health Maintenance:  Normal pap and negative HRHPV on 12-04-2019.   Review of Systems:  Pertinent items noted in HPI and remainder of comprehensive ROS otherwise negative.  Physical Exam:   General:  Alert, oriented and cooperative. Patient appears to be in no acute distress.  Mental Status: Normal mood and affect. Normal behavior. Normal judgment and thought content.   Respiratory: Normal respiratory effort, no problems with respiration noted  Rest of physical exam deferred due to type of encounter  Labs and Imaging No results found for this or any previous visit (from the past 336 hour(s)). No results found.     Assessment and Plan:     1. Irregular periods/menstrual cycles Rx: - Norethindrone Acetate-Ethinyl Estrad-FE (LOESTRIN 24 FE) 1-20 MG-MCG(24) tablet; Take 1 tablet by mouth daily.  Dispense: 28 tablet; Refill: 11  2. S/P tubal ligation  3. S/P endometrial ablation  4. Encounter for initial prescription of contraceptive pills AUB. - Loestrin 24 1/20 Rx      I discussed the assessment and treatment plan with the patient. The patient was provided an opportunity to ask questions and all were answered. The patient agreed with the plan and demonstrated an understanding of the instructions.   The patient was advised to call back or seek an in-person evaluation/go to the ED if the symptoms  worsen or if the condition fails to improve as anticipated.  I provided 15 minutes of face-to-face time during this encounter.   Coral Ceo, MD [facility], [facility] 05/26/2020 1:17 PM

## 2020-05-26 NOTE — Progress Notes (Signed)
Pt is on the phone preparing for virtual visit with provider. She would like to discuss getting on contraceptive pills to regulate her menstrual cycle. Pt reports her cycles are irregular and long. Pt has BTL for contraception.

## 2020-06-27 ENCOUNTER — Other Ambulatory Visit: Payer: Self-pay

## 2020-06-27 DIAGNOSIS — N898 Other specified noninflammatory disorders of vagina: Secondary | ICD-10-CM

## 2020-06-27 MED ORDER — METRONIDAZOLE 500 MG PO TABS: 500.0000 mg | ORAL_TABLET | Freq: Two times a day (BID) | ORAL | 0 refills | Status: DC

## 2020-06-27 NOTE — Progress Notes (Signed)
Rx sent per protocol for BV sx's.

## 2020-07-26 ENCOUNTER — Ambulatory Visit
Admission: RE | Admit: 2020-07-26 | Discharge: 2020-07-26 | Disposition: A | Discharge status: Home / Self Care | Payer: Medicaid Other | Source: Ambulatory Visit | Attending: Family Medicine | Admitting: Family Medicine

## 2020-07-26 ENCOUNTER — Other Ambulatory Visit: Payer: Self-pay | Admitting: Family Medicine

## 2020-07-26 DIAGNOSIS — R0789 Other chest pain: Secondary | ICD-10-CM

## 2020-09-15 ENCOUNTER — Other Ambulatory Visit: Payer: Self-pay

## 2020-09-15 DIAGNOSIS — N898 Other specified noninflammatory disorders of vagina: Secondary | ICD-10-CM

## 2020-09-15 MED ORDER — TERCONAZOLE 0.4 % VA CREA: 1.0000 | TOPICAL_CREAM | Freq: Every day | VAGINAL | 0 refills | Status: DC

## 2020-09-15 NOTE — Progress Notes (Signed)
Rx sent per pt request for yeast stated OTC yeast treatment does not work.  Rx sent per protocol.

## 2020-09-20 ENCOUNTER — Ambulatory Visit (INDEPENDENT_AMBULATORY_CARE_PROVIDER_SITE_OTHER): Payer: 59 | Admitting: Cardiology

## 2020-09-20 ENCOUNTER — Other Ambulatory Visit: Payer: Self-pay

## 2020-09-20 ENCOUNTER — Encounter: Payer: Self-pay | Admitting: Cardiology

## 2020-09-20 VITALS — BP 112/72 | HR 84 | Ht 63.0 in | Wt 132.6 lb

## 2020-09-20 DIAGNOSIS — R9431 Abnormal electrocardiogram [ECG] [EKG]: Secondary | ICD-10-CM | POA: Diagnosis not present

## 2020-09-20 DIAGNOSIS — R0789 Other chest pain: Secondary | ICD-10-CM

## 2020-09-20 DIAGNOSIS — R002 Palpitations: Secondary | ICD-10-CM | POA: Insufficient documentation

## 2020-09-20 NOTE — Assessment & Plan Note (Signed)
Pretty significant voltage on EKG suggestive of LVH with exception of lead I which has dramatically less voltage than the remainder of the limb leads.  With her having chest discomfort and there being some borderline repolarization changes, would like to exclude LVH but also potential pericardial effusion.

## 2020-09-20 NOTE — Progress Notes (Signed)
Primary Care Provider: Donald Prose, MD Cardiologist: No primary care provider on file. Electrophysiologist: None  Clinic Note: Chief Complaint  Patient presents with  . New Patient (Initial Visit)    Chest pain, palpitations   HPI:    Katrina Wiley is a 39 y.o. female with a PMH below who presents today for evaluation of chest pain and palpitations at the request of Katrina Macadam, MD  Problem List Items Addressed This Visit    Nonspecific abnormal electrocardiogram (ECG) (EKG)    Pretty significant voltage on EKG suggestive of LVH with exception of lead I which has dramatically less voltage than the remainder of the limb leads.  With her having chest discomfort and there being some borderline repolarization changes, would like to exclude LVH but also potential pericardial effusion.      Relevant Orders   EKG 12-Lead (Completed)   ECHOCARDIOGRAM COMPLETE   Chest wall pain - Primary    Reproducible parasternal chest pain on exam.  Most likely costochondritis or simple precordial chest pain.  However she does have an abnormal EKG with LVH and subtle repolarization changes.  We will check a 2D echocardiogram just to exclude pericardial effusion but also to evaluate for LVH.  Unless this is abnormal, I think the routine therapy for her pain will be as needed short course of ibuprofen.  Probably 400 mg twice daily for 2 to 3 days.      Relevant Orders   EKG 12-Lead (Completed)   ECHOCARDIOGRAM COMPLETE   Palpitations    Relatively rare, fleeting episodes lasting few seconds.  They may not even happen every month.  As such I do not think will be to capture anything on the monitor that is reasonable to treat.  If she does have prolonged episodes or are symptomatic with lightheadedness dizziness or near syncope, we could place a monitor.  But for now we will hold off.      Relevant Orders   EKG 12-Lead (Completed)   ECHOCARDIOGRAM COMPLETE      Katrina Wiley was seen on  October 15 by Katrina. Mannie Wiley for annual health review and discussion about episodic recurrent anterior chest pain with some elevated heart rates. -> Episodes of chest pain ago off and on which have been occurring for the past 2 years.  Can occur with both activity or rest.  Last about 30 minutes and resolve spontaneously.  Not associated with other symptoms of dyspnea nausea vomiting.  Maybe little bit sweaty.  No radiation of symptoms.  Not associate with stress she said this is not like her.  It has woken her up from sleep occasionally last occurrence was in September. -> Also notes episodes of increased heart rates that seem to go off on their own for short period time only did come back down really quickly.  No associated with dizziness or lightheadedness. -->  Referred to cardiology.  Recent Hospitalizations: N/A  Reviewed  CV studies:    The following studies were reviewed today: (if available, images/films reviewed: From Epic Chart or Care Everywhere) . None:  Interval History:   Katrina Wiley presents today for cardiology evaluation to discuss results of chest pain palpitation symptoms that she noted and discussed with the PCP.  She really reiterated the same type symptoms.  She says that labs palpitations began after she was tested positive for Covid back in January.  Really the only symptom she had was poor taste and smell and maybe some dyspnea with her  coughing.  But she did off-and-on chest discomfort at that time.  Again she describes off-and-on chest pain episodes lasting up to 36 time.  These usually occur at rest she is making plans.  Oftentimes happens when she is watching TV, but does not happen with activity.  She also has these episodes of intermittent heart rates going up and lasting about an hour at a time.  Again a little dizziness but not chest pain or pressure.  The rapid heart rate spells seem to be relatively self-limiting.  CV Review of Symptoms (Summary): positive for  - chest pain, irregular heartbeat and palpitations negative for - dyspnea on exertion, edema, orthopnea, paroxysmal nocturnal dyspnea, shortness of breath or Reassessed no syncope or TIA/ischemic symptoms claudication  The patient does not have symptoms concerning for COVID-19 infection (fever, chills, cough, or new shortness of breath).   REVIEWED OF SYSTEMS   Review of Systems  Constitutional: Negative for malaise/fatigue and weight loss.  HENT: Negative for nosebleeds.   Respiratory: Negative for shortness of breath.   Cardiovascular: Negative for leg swelling.  Gastrointestinal: Negative for abdominal pain, blood in stool and melena.  Genitourinary: Positive for frequency. Negative for hematuria.  Musculoskeletal: Negative for falls and joint pain.  Neurological: Negative for dizziness, weakness and headaches.  Psychiatric/Behavioral: Negative.     I have reviewed and (if needed) personally updated the patient's problem list, medications, allergies, past medical and surgical history, social and family history.   PAST MEDICAL HISTORY   Past Medical History:  Diagnosis Date  . Abnormal vaginal Pap smear    cryo done in Katrina Wiley office  . Allergic rhinitis due to allergen   . History of migraine headaches    Usually without aura  . Hyperlipidemia    no meds, diet controlled  . Iron deficiency anemia, unspecified    Iron deficiency; has required IV iron because she did not tolerate oral iron  . Preterm labor   . Sickle cell trait ([place])   . SVD (spontaneous vaginal delivery)    x 3    PAST SURGICAL HISTORY   Past Surgical History:  Procedure Laterality Date  . DILATION AND CURETTAGE OF UTERUS    . DILITATION & CURRETTAGE/HYSTROSCOPY WITH HYDROTHERMAL ABLATION N/A 06/13/2017   Procedure: DILATATION & CURETTAGE/HYSTEROSCOPY WITH ATTEMPTED HYDROTHERMAL ABLATION;  Surgeon: Katrina Filbert, MD;  Location: [facility];  Service: Gynecology;  Laterality: N/A;  . INCISION AND DRAINAGE  PERIRECTAL ABSCESS  2008  . TUBAL LIGATION    . WISDOM TOOTH EXTRACTION      Immunization History  Administered Date(s) Administered  . DTaP 10/05/1982, 12/15/1982, 02/16/1983, 01/02/1984, 01/18/1987  . Hepatitis B 07/04/2004, 08/24/2004, 05/14/2005  . IPV 10/05/1982, 12/15/1982, 02/16/1983, 01/02/1984  . MMR 09/19/1983, 12/07/2010  . PFIZER SARS-COV-2 Vaccination 02/07/2020, 03/09/2020  . PPD Test 04/18/2010  . Tdap 04/18/2013    MEDICATIONS/ALLERGIES   Current Meds  Medication Sig  . ferrous sulfate 325 (65 FE) MG EC tablet Take 325 mg by mouth 3 (three) times daily with meals.  . fluticasone (FLONASE) 50 MCG/ACT nasal spray Place into both nostrils.  . folic acid (FOLVITE) 1 MG tablet Take 2 tablets (2 mg total) by mouth daily.  Marland Kitchen ibuprofen (ADVIL) 800 MG tablet Take 1 tablet (800 mg total) by mouth every 8 (eight) hours as needed.    No Known Allergies  SOCIAL HISTORY/FAMILY HISTORY   Reviewed in Epic:  Pertinent findings:  Social History   Tobacco Use  . Smoking status: Never  Smoker  . Smokeless tobacco: Never Used  Vaping Use  . Vaping Use: Never used  Substance Use Topics  . Alcohol use: Yes    Alcohol/week: 0.0 standard drinks    Comment: occasional  . Drug use: No   Social History   Social History Narrative   Single mother of 3 daughters: Enis Gash (64), Tanasiah (61), [place] (8)   Never smoked.  Rare wine".   No recreational drugs.   Diet: Eats all types of foods except beef and pork.   Exercise: No set routine, but intermittent walking.   Religion: [facility], enjoys traveling.   Family History  Problem Relation Age of Onset  . Anemia Mother   . Hypertension Father   . Hypertension Maternal Grandmother   . Hyperlipidemia Maternal Grandmother   . Stroke Maternal Grandmother   . Hypertension Maternal Grandfather   . Hyperlipidemia Maternal Grandfather   . Diabetes Paternal Grandmother   . Cirrhosis Paternal Grandfather      OBJCTIVE -PE, EKG, labs   Wt Readings from Last 3 Encounters:  09/20/20 132 lb 9.6 oz (60.1 kg)  01/09/19 124 lb 6.4 oz (56.4 kg)  11/13/18 127 lb 8 oz (57.8 kg)    Physical Exam: BP 112/72   Pulse 84   Ht $R'5\' 3"'vT$  (1.6 m)   Wt 132 lb 9.6 oz (60.1 kg)   BMI 23.49 kg/m  Physical Exam Vitals reviewed.  Constitutional:      General: She is not in acute distress.    Appearance: Normal appearance. She is normal weight. She is not ill-appearing or toxic-appearing.  HENT:     Head: Normocephalic and atraumatic.  Neck:     Vascular: No carotid bruit, hepatojugular reflux or JVD.  Cardiovascular:     Rate and Rhythm: Normal rate and regular rhythm. Occasional extrasystoles are present.    Chest Wall: PMI is not displaced.     Pulses: Normal pulses and intact distal pulses.     Heart sounds: Normal heart sounds. Heart sounds not distant. No murmur heard.  No friction rub. No gallop.   Pulmonary:     Effort: Pulmonary effort is normal. No respiratory distress.     Breath sounds: Normal breath sounds. No rhonchi.  Chest:     Chest wall: Tenderness present.  Abdominal:     General: Bowel sounds are normal. There is no distension.     Palpations: Abdomen is soft. There is no mass (No HSM).  Musculoskeletal:        General: No swelling. Normal range of motion.     Cervical back: Normal range of motion and neck supple.  Neurological:     General: No focal deficit present.     Mental Status: She is alert and oriented to person, place, and time.  Psychiatric:        Mood and Affect: Mood normal.        Behavior: Behavior normal.        Thought Content: Thought content normal.        Judgment: Judgment normal.     Adult ECG Report  Rate: 84 ;  Rhythm: normal sinus rhythm and Mild LVH criteria.  Diastolic ischemia based on repolarization changes.  Otherwise normal axis and intervals durations.;   Narrative Interpretation: Borderline EKG.  Recent Labs: 09/09/2020  Na+ 138, K+  3.6, Cl- 103, HCO3- 28, BUN 8.0, Cr 0.68, Glu 65, Ca2+ 10.0; AST 16, AlkP 55  CBC: W 4.8,  H/H 12.7/37.7, Plt 278; serum iron 46 (low), transferred 360, iron saturation 9%, ferritin 2.5  TC 2131, TG 60, HDL 62, LDL 159  No results found for: CHOL, HDL, LDLCALC, LDLDIRECT, TRIG, CHOLHDL  No results found for: TSH  ASSESSMENT/PLAN    Problem List Items Addressed This Visit    Nonspecific abnormal electrocardiogram (ECG) (EKG)    Pretty significant voltage on EKG suggestive of LVH with exception of lead I which has dramatically less voltage than the remainder of the limb leads.  With her having chest discomfort and there being some borderline repolarization changes, would like to exclude LVH but also potential pericardial effusion.      Relevant Orders   EKG 12-Lead (Completed)   ECHOCARDIOGRAM COMPLETE   Chest wall pain - Primary    Reproducible parasternal chest pain on exam.  Most likely costochondritis or simple precordial chest pain.  However she does have an abnormal EKG with LVH and subtle repolarization changes.  We will check a 2D echocardiogram just to exclude pericardial effusion but also to evaluate for LVH.  Unless this is abnormal, I think the routine therapy for her pain will be as needed short course of ibuprofen.  Probably 400 mg twice daily for 2 to 3 days.      Relevant Orders   EKG 12-Lead (Completed)   ECHOCARDIOGRAM COMPLETE   Palpitations    Relatively rare, fleeting episodes lasting few seconds.  They may not even happen every month.  As such I do not think will be to capture anything on the monitor that is reasonable to treat.  If she does have prolonged episodes or are symptomatic with lightheadedness dizziness or near syncope, we could place a monitor.  But for now we will hold off.      Relevant Orders   EKG 12-Lead (Completed)   ECHOCARDIOGRAM COMPLETE       COVID-19 Education: The signs and symptoms of COVID-19 were discussed with the patient and  how to seek care for testing (follow up with PCP or arrange E-visit).   The importance of social distancing and COVID-19 vaccination was discussed today. The patient is practicing social distancing & Masking.   I spent a total of 67minutes with the patient spent in direct patient consultation.  Additional time spent with chart review  / charting (studies, outside notes, etc): 10 Total Time: 36 min   Current medicines are reviewed at length with the patient today.  (+/- concerns) n/a  This visit occurred during the SARS-CoV-2 public health emergency.  Safety protocols were in place, including screening questions prior to the visit, additional usage of staff PPE, and extensive cleaning of exam room while observing appropriate contact time as indicated for disinfecting solutions.  Notice: This dictation was prepared with Dragon dictation along with smaller phrase technology. Any transcriptional errors that result from this process are unintentional and may not be corrected upon review.  Patient Instructions / Medication Changes & Studies & Tests Ordered   Patient Instructions  Medication Instructions:    for chest wall pain - use Ibuprofen    400 mg twice a day for 2 to 3 days   *If you need a refill on your cardiac medications before your next appointment, please call your pharmacy*   Lab Work: Not needed    Testing/Procedures:  will be schedule at [facility] has requested that you have an echocardiogram. Echocardiography is a painless test that uses sound waves to create  images of your heart. It provides your doctor with information about the size and shape of your heart and how well your heart's chambers and valves are working. This procedure takes approximately one hour. There are no restrictions for this procedure.     Monitor when and how frequent you are having palpitation   - may  Order a test later if become more frequent   Follow-Up: At  [facility], you and your health needs are our priority.  As part of our continuing mission to provide you with exceptional heart care, we have created designated Provider Care Teams.  These Care Teams include your primary Cardiologist (physician) and Advanced Practice Providers (APPs -  Physician Assistants and Nurse Practitioners) who all work together to provide you with the care you need, when you need it.      Your next appointment:    as needed  The format for your next appointment:   In Person  Provider:   Glenetta Hew, MD   Other Instructions  Monitor when and how frequent you are having palpitation   - may  Order a test later if become more frequent     Studies Ordered:   Orders Placed This Encounter  Procedures  . EKG 12-Lead  . ECHOCARDIOGRAM COMPLETE     Glenetta Hew, M.D., M.S. Interventional Cardiologist   Pager # [phone] Phone # [phone] [address] [place],  [address]   Thank you for choosing Heartcare at [facility]!!

## 2020-09-20 NOTE — Patient Instructions (Signed)
Medication Instructions:    for chest wall pain - use Ibuprofen    400 mg twice a day for 2 to 3 days   *If you need a refill on your cardiac medications before your next appointment, please call your pharmacy*   Lab Work: Not needed    Testing/Procedures:  will be schedule at [address] Your physician has requested that you have an echocardiogram. Echocardiography is a painless test that uses sound waves to create images of your heart. It provides your doctor with information about the size and shape of your heart and how well your heart's chambers and valves are working. This procedure takes approximately one hour. There are no restrictions for this procedure.     Monitor when and how frequent you are having palpitation   - may  Order a test later if become more frequent   Follow-Up: At [facility], you and your health needs are our priority.  As part of our continuing mission to provide you with exceptional heart care, we have created designated Provider Care Teams.  These Care Teams include your primary Cardiologist (physician) and Advanced Practice Providers (APPs -  Physician Assistants and Nurse Practitioners) who all work together to provide you with the care you need, when you need it.      Your next appointment:    as needed  The format for your next appointment:   In Person  Provider:   Bryan Lemma, MD   Other Instructions  Monitor when and how frequent you are having palpitation   - may  Order a test later if become more frequent

## 2020-09-20 NOTE — Assessment & Plan Note (Signed)
Relatively rare, fleeting episodes lasting few seconds.  They may not even happen every month.  As such I do not think will be to capture anything on the monitor that is reasonable to treat.  If she does have prolonged episodes or are symptomatic with lightheadedness dizziness or near syncope, we could place a monitor.  But for now we will hold off.

## 2020-09-20 NOTE — Assessment & Plan Note (Addendum)
Reproducible parasternal chest pain on exam.  Most likely costochondritis or simple precordial chest pain.  However she does have an abnormal EKG with LVH and subtle repolarization changes.  We will check a 2D echocardiogram just to exclude pericardial effusion but also to evaluate for LVH.  Unless this is abnormal, I think the routine therapy for her pain will be as needed short course of ibuprofen.  Probably 400 mg twice daily for 2 to 3 days.

## 2020-09-27 ENCOUNTER — Encounter: Payer: Self-pay | Admitting: Cardiology

## 2020-10-11 ENCOUNTER — Ambulatory Visit (HOSPITAL_COMMUNITY): Payer: 59 | Attending: Cardiovascular Disease

## 2020-10-11 ENCOUNTER — Other Ambulatory Visit: Payer: Self-pay

## 2020-10-11 DIAGNOSIS — R0789 Other chest pain: Secondary | ICD-10-CM | POA: Insufficient documentation

## 2020-10-11 DIAGNOSIS — R002 Palpitations: Secondary | ICD-10-CM | POA: Insufficient documentation

## 2020-10-11 DIAGNOSIS — R9431 Abnormal electrocardiogram [ECG] [EKG]: Secondary | ICD-10-CM | POA: Insufficient documentation

## 2020-10-11 LAB — ECHOCARDIOGRAM COMPLETE
Area-P 1/2: 3.11 cm2
S' Lateral: 2.7 cm

## 2020-10-18 ENCOUNTER — Telehealth: Payer: Self-pay | Admitting: Physician Assistant

## 2020-10-18 NOTE — Telephone Encounter (Signed)
Scheduled appointment per 11/22 new patient referral. Spoke to patient who is aware of appointment date and time.  

## 2020-10-25 ENCOUNTER — Ambulatory Visit (INDEPENDENT_AMBULATORY_CARE_PROVIDER_SITE_OTHER): Payer: Medicaid Other | Admitting: *Deleted

## 2020-10-25 ENCOUNTER — Other Ambulatory Visit: Payer: Self-pay

## 2020-10-25 VITALS — BP 125/76 | HR 92 | Temp 98.8°F | Ht 63.0 in | Wt 124.0 lb

## 2020-10-25 DIAGNOSIS — R8290 Unspecified abnormal findings in urine: Secondary | ICD-10-CM

## 2020-10-25 LAB — POCT URINALYSIS DIPSTICK (MANUAL)
Leukocytes, UA: NEGATIVE
Nitrite, UA: NEGATIVE
Poct Bilirubin: NEGATIVE
Poct Blood: NEGATIVE
Poct Glucose: NORMAL mg/dL
Poct Ketones: NEGATIVE
Poct Urobilinogen: NORMAL mg/dL
Spec Grav, UA: 1.02 (ref 1.010–1.025)
pH, UA: 6.5 (ref 5.0–8.0)

## 2020-10-25 NOTE — Progress Notes (Signed)
   SUBJECTIVE: Katrina Wiley is a 39 y.o. female who complains of urine odor x 3 weeks, without flank pain, fever, chills, or abnormal vaginal discharge or bleeding.   OBJECTIVE: Appears well, in no apparent distress.  Vital signs are normal. Urine dipstick shows positive for protein.    ASSESSMENT: Urine odor  PLAN: Treatment per orders.  Call or return to clinic prn if these symptoms worsen or fail to improve as anticipated.  Clovis Pu, RN

## 2020-10-27 LAB — URINE CULTURE

## 2020-10-31 NOTE — Progress Notes (Signed)
Katrina Wiley  Katrina Wiley, Vyvyan, MD [phone] [address]. [address] [place] [id number]  DIAGNOSIS:  1) Iron Deficiency Anemia 2) Sickle Cell Trait   PRIOR THERAPY:  1) PO iron replacement with Ferralet. Not responsive to PO iron replacement  CURRENT THERAPY: PRN IV iron. Last dose on 04/29/2018  INTERVAL HISTORY: Katrina Wiley 39 y.o. female returns to the clinic today for a follow up visit. The patient was seen in the clinic in 2019 by Dr. Candise CheKale. At that time, she was noted to have severe iron deficiency anemia with undetectable ferritin levels <4. She also was found to have sickle cell trait. She received IV iron with injectafer x2 and tolerated that well. Her last dose was given on 04/29/2018. The patient's anemia was thought to be secondary to heavy menstrual losses. She reports menstrual periods every 3 weeks. She is presently not on any contraceptive pills. She had a endometrial ablation in the past which did not improve her menstral periods. She states her menstrual cycles last 5-6 days. For the first 3 days, she states she has to use super tampons and pads. She states she changes her tampon every 4 hours or so. [facility] The patient mentions she is interested in following up with her OBGYN regarding her options to help regulate her menstrual cycles.   The patient recently had a follow-up visit with her PCP on 10/14/2020.  The patient's lab work from that day showed persistent iron deficiency without anemia with a hemoglobin of 12.7, RDW elevated at 15.9, and MCV low at 72.8, and elevated TIBC at 505, low iron at 46, a low iron saturation 9%, and a very low ferritin at 2.5.  The patient reportedly tried iron supplements in the past, the last time being about 1 year ago. She states she had constipation with her iron supplements. The patient is here today for evaluation and consideration of IV iron infusions.    Per patient report,  she has had iron deficiency/anemia since she was pregnant with her first child when she was 39 years old. Her lowest hbg on file was 8.1. Her anemia is microcytic.  Her WBCs and platelets are within normal limits.  The patient denies any abnormal bleeding or bruising. She denies melena, hematochezia, epistaxis, gingival bleeding, hemoptysis, or hematuria. She recently had a UA performed which was negative for blood. The patient is not on a blood thinner. She has never had a colonoscopy due to not meeting the age requirements for screening and not having any GI symptoms. Denies family history of CRC. She denies any prior history of bariatric surgery.  The patient reports that she craves ice. She reports fatigue. 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  She denies any chest pain. She denies lightheadedness.  She reports she will take 800 mg of of ibuprofen a "few" times a month for a headache. She denies fever, chills, lymphadenopathy, or weight loss.  She denies nausea, vomiting, diarrhea, or constipation.  She denies any particular dietary habits such as being a vegan or vegetarian; however, she does not eat red meat frequently due to patient preference. She denies any other history of vitamin deficiencies. She takes folic acid and a daily multivitamin.   She is here for re-evaluation and consideration of IV iron infusions.   MEDICAL HISTORY: Past Medical History:  Diagnosis Date  . Abnormal vaginal Pap smear  cryo done in Dr Verdell Carmine office  . Allergic rhinitis due to allergen   . History of migraine headaches    Usually without aura  . Hyperlipidemia    no meds, diet controlled  . Iron deficiency anemia, unspecified    Iron deficiency; has required IV iron because she did not tolerate oral iron  . Preterm labor   . Sickle cell trait (HCC)   . SVD (spontaneous vaginal delivery)    x 3    ALLERGIES:  has No Known Allergies.  MEDICATIONS:  Current  Outpatient Medications  Medication Sig Dispense Refill  . ferrous sulfate 325 (65 FE) MG EC tablet Take 325 mg by mouth 3 (three) times daily with meals.    . fluticasone (FLONASE) 50 MCG/ACT nasal spray Place into both nostrils.    . folic acid (FOLVITE) 1 MG tablet Take 2 tablets (2 mg total) by mouth daily. 60 tablet 11  . ibuprofen (ADVIL) 800 MG tablet Take 1 tablet (800 mg total) by mouth every 8 (eight) hours as needed. 30 tablet 5   No current facility-administered medications for this visit.    SURGICAL HISTORY:  Past Surgical History:  Procedure Laterality Date  . DILATION AND CURETTAGE OF UTERUS    . DILITATION & CURRETTAGE/HYSTROSCOPY WITH HYDROTHERMAL ABLATION N/A 06/13/2017   Procedure: DILATATION & CURETTAGE/HYSTEROSCOPY WITH ATTEMPTED HYDROTHERMAL ABLATION;  Surgeon: Allie Bossier, MD;  Location: [facility];  Service: Gynecology;  Laterality: N/A;  . INCISION AND DRAINAGE PERIRECTAL ABSCESS  2008  . TUBAL LIGATION    . WISDOM TOOTH EXTRACTION      REVIEW OF SYSTEMS:   Review of Systems  Constitutional: Positive for fatigue. Negative for appetite change, chills, fever and unexpected weight change.  HENT: Negative for mouth sores, nosebleeds, sore throat and trouble swallowing.   Eyes: Negative for eye problems and icterus.  Respiratory: Positive for shortness of breath with exertion. Negative for cough, hemoptysis, and wheezing.  Cardiovascular: Negative for chest pain and leg swelling.  Gastrointestinal: Negative for abdominal pain, constipation, diarrhea, nausea and vomiting.  Genitourinary: Negative for bladder incontinence, difficulty urinating, dysuria, frequency and hematuria.   Musculoskeletal: Negative for back pain, gait problem, neck pain and neck stiffness.  Skin: Negative for itching and rash.  Neurological: Negative for dizziness, extremity weakness, gait problem, headaches, light-headedness and seizures.  Hematological: Positive for menstrual periods every 3  weeks. Negative for adenopathy. Does not bruise/bleed easily.  Psychiatric/Behavioral: Negative for confusion, depression and sleep disturbance. The patient is not nervous/anxious.     PHYSICAL EXAMINATION:  Blood pressure 126/78, pulse 93, temperature 98.2 F (36.8 C), temperature source Tympanic, resp. rate 18, height 5\' 3"  (1.6 m), weight 129 lb 3.2 oz (58.6 kg), last menstrual period 10/10/2020, SpO2 100 %.  ECOG PERFORMANCE STATUS: 1 - Symptomatic but completely ambulatory  Physical Exam  Constitutional: Oriented to person, place, and time and well-developed, well-nourished, and in no distress.  HENT:  Head: Normocephalic and atraumatic.  Mouth/Throat: Oropharynx is clear and moist. No oropharyngeal exudate.  Eyes: Conjunctivae are normal. Right eye exhibits no discharge. Left eye exhibits no discharge. No scleral icterus.  Neck: Normal range of motion. Neck supple.  Cardiovascular: Normal rate, regular rhythm, normal heart sounds and intact distal pulses.   Pulmonary/Chest: Effort normal and breath sounds normal. No respiratory distress. No wheezes. No rales.  Abdominal: Soft. Bowel sounds are normal. Exhibits no distension and no mass. There is no tenderness.  Musculoskeletal: Normal range of motion. Exhibits no edema.  Lymphadenopathy:    No cervical adenopathy.  Neurological: Alert and oriented to person, place, and time. Exhibits normal muscle tone. Gait normal. Coordination normal.  Skin: Skin is warm and dry. No rash noted. Not diaphoretic. No erythema. No pallor.  Psychiatric: Mood, memory and judgment normal.  Vitals reviewed.  LABORATORY DATA: Lab Results  Component Value Date   WBC 5.6 11/13/2018   HGB 12.4 11/13/2018   HCT 37.1 11/13/2018   MCV 80.1 11/13/2018   PLT 194 11/13/2018      Chemistry      Component Value Date/Time   NA 139 04/16/2018 1117   K 3.5 04/16/2018 1117   CL 105 04/16/2018 1117   CO2 27 04/16/2018 1117   BUN 11 04/16/2018 1117    CREATININE 0.77 04/16/2018 1117      Component Value Date/Time   CALCIUM 9.7 04/16/2018 1117   ALKPHOS 50 04/16/2018 1117   AST 17 04/16/2018 1117   ALT 13 04/16/2018 1117   BILITOT 0.5 04/16/2018 1117       RADIOGRAPHIC STUDIES:  ECHOCARDIOGRAM COMPLETE  Result Date: 10/11/2020    ECHOCARDIOGRAM REPORT   Patient Name:   Katrina Wiley Date of Exam: 10/11/2020 Medical Rec #:  [id number]         Height:       63.0 in Accession #:    [id number]        Weight:       132.6 lb Date of Birth:  02-11-1981         BSA:          1.624 m Patient Age:    39 years          BP:           111/82 mmHg Patient Gender: F                 HR:           87 bpm. Exam Location:  [address] Procedure: 2D Echo, Cardiac Doppler and Color Doppler Indications:    R94.31 Abnormal ECG  History:        Patient has prior history of Echocardiogram examinations and                 Patient has no prior history of Echocardiogram examinations.                 Signs/Symptoms:Chest Pain and Palpitations. COVID 19 (2021).  Sonographer:    Clearence Ped RCS Referring Phys: [id number] DAVID W HARDING IMPRESSIONS  1. Left ventricular ejection fraction, by estimation, is 60 to 65%. The left ventricle has normal function. The left ventricle has no regional wall motion abnormalities. Left ventricular diastolic parameters were normal.  2. Right ventricular systolic function is normal. The right ventricular size is normal.  3. The mitral valve is normal in structure. No evidence of mitral valve regurgitation. No evidence of mitral stenosis.  4. The aortic valve is normal in structure. Aortic valve regurgitation is not visualized. No aortic stenosis is present.  5. The inferior vena cava is normal in size with greater than 50% respiratory variability, suggesting right atrial pressure of 3 mmHg. FINDINGS  Left Ventricle: Left ventricular ejection fraction, by estimation, is 60 to 65%. The left ventricle has normal function. The left ventricle has  no regional wall motion abnormalities. Global longitudinal strain performed but not reported based on interpreter judgement due to suboptimal tracking. The left ventricular internal cavity size was normal in  size. There is no left ventricular hypertrophy. Left ventricular diastolic parameters were normal. Right Ventricle: The right ventricular size is normal. No increase in right ventricular wall thickness. Right ventricular systolic function is normal. Left Atrium: Left atrial size was normal in size. Right Atrium: Right atrial size was normal in size. Pericardium: There is no evidence of pericardial effusion. Mitral Valve: The mitral valve is normal in structure. No evidence of mitral valve regurgitation. No evidence of mitral valve stenosis. Tricuspid Valve: The tricuspid valve is normal in structure. Tricuspid valve regurgitation is not demonstrated. No evidence of tricuspid stenosis. Aortic Valve: The aortic valve is normal in structure. Aortic valve regurgitation is not visualized. No aortic stenosis is present. Pulmonic Valve: The pulmonic valve was normal in structure. Pulmonic valve regurgitation is not visualized. No evidence of pulmonic stenosis. Aorta: The aortic root is normal in size and structure. Venous: The inferior vena cava is normal in size with greater than 50% respiratory variability, suggesting right atrial pressure of 3 mmHg. IAS/Shunts: No atrial level shunt detected by color flow Doppler.  LEFT VENTRICLE PLAX 2D LVIDd:         4.20 cm LVIDs:         2.70 cm LV PW:         0.90 cm LV IVS:        0.70 cm LVOT diam:     1.90 cm LV SV:         41 LV SV Index:   25 LVOT Area:     2.84 cm  RIGHT VENTRICLE RV Basal diam:  2.50 cm RV S prime:     11.50 cm/s TAPSE (M-mode): 1.7 cm LEFT ATRIUM             Index       RIGHT ATRIUM          Index LA diam:        2.50 cm 1.54 cm/m  RA Area:     7.41 cm LA Vol (A2C):   26.7 ml 16.44 ml/m RA Volume:   13.40 ml 8.25 ml/m LA Vol (A4C):   28.2 ml 17.37  ml/m LA Biplane Vol: 29.5 ml 18.17 ml/m  AORTIC VALVE LVOT Vmax:   74.40 cm/s LVOT Vmean:  47.200 cm/s LVOT VTI:    0.145 m  AORTA Ao Root diam: 2.90 cm Ao Asc diam:  2.60 cm MITRAL VALVE MV Area (PHT):             SHUNTS MV Decel Time:             Systemic VTI:  0.14 m MV E velocity: 68.10 cm/s  Systemic Diam: 1.90 cm MV A velocity: 41.60 cm/s MV E/A ratio:  1.64 Mihai Croitoru MD Electronically signed by Thurmon Fair MD Signature Date/Time: 10/11/2020/4:40:31 PM    Final      ASSESSMENT/PLAN:  This is a very pleasant 39 year old African-American female with:  1. Iron Deficiency anemia due to heavy menstrual losses. 2. Sickle Cell trait  PLAN -Discussed patient's most recent labs from 10/14/20, which noted Hgb WNL at 12.7. Low ferritin at 2.5. Low iron at 46. Low iron saturation at 9%, and elevated TIBC at 505.  -proceed with  IV Injectafer x2 doses since she tolerated that well in the past. Otherwise, we will switch to another IV iron based on insurance preference. -Will schedule F/U in 2 months with repeat labs and office visit with Dr. Candise Che to assess response to IV iron and determine  additional needs. -f/u with Gyn to address heavy menstrual losses.   No orders of the defined types were placed in this encounter.    Katrina Mcguirt L Jodi Criscuolo, PA-C 11/01/20

## 2020-11-01 ENCOUNTER — Other Ambulatory Visit: Payer: Self-pay

## 2020-11-01 ENCOUNTER — Inpatient Hospital Stay: Payer: 59 | Attending: Physician Assistant | Admitting: Physician Assistant

## 2020-11-01 ENCOUNTER — Encounter: Payer: Self-pay | Admitting: Physician Assistant

## 2020-11-01 ENCOUNTER — Other Ambulatory Visit: Payer: Self-pay | Admitting: Physician Assistant

## 2020-11-01 VITALS — BP 126/78 | HR 93 | Temp 98.2°F | Resp 18 | Ht 63.0 in | Wt 129.2 lb

## 2020-11-01 DIAGNOSIS — D509 Iron deficiency anemia, unspecified: Secondary | ICD-10-CM

## 2020-11-01 DIAGNOSIS — D573 Sickle-cell trait: Secondary | ICD-10-CM | POA: Diagnosis not present

## 2020-11-01 DIAGNOSIS — D500 Iron deficiency anemia secondary to blood loss (chronic): Secondary | ICD-10-CM | POA: Insufficient documentation

## 2020-11-01 DIAGNOSIS — N920 Excessive and frequent menstruation with regular cycle: Secondary | ICD-10-CM | POA: Insufficient documentation

## 2020-11-01 NOTE — Addendum Note (Signed)
Addended by: Caprice Red on: 11/01/2020 02:54 PM   Modules accepted: Orders

## 2020-11-07 ENCOUNTER — Telehealth: Payer: Self-pay | Admitting: Hematology

## 2020-11-07 NOTE — Telephone Encounter (Signed)
Scheduled per los. Called and spoke with patient. Confirmed appt 

## 2020-11-15 ENCOUNTER — Inpatient Hospital Stay: Payer: 59

## 2020-11-15 ENCOUNTER — Ambulatory Visit: Payer: Medicaid Other

## 2020-11-15 ENCOUNTER — Other Ambulatory Visit: Payer: Self-pay

## 2020-11-15 VITALS — BP 115/80 | HR 90 | Temp 99.4°F | Resp 18

## 2020-11-15 DIAGNOSIS — D500 Iron deficiency anemia secondary to blood loss (chronic): Secondary | ICD-10-CM | POA: Diagnosis not present

## 2020-11-15 DIAGNOSIS — D509 Iron deficiency anemia, unspecified: Secondary | ICD-10-CM

## 2020-11-15 MED ORDER — SODIUM CHLORIDE 0.9 % IV SOLN
750.0000 mg | Freq: Once | INTRAVENOUS | Status: AC
  Administered 2020-11-15: 750 mg via INTRAVENOUS
  Filled 2020-11-15: qty 15

## 2020-11-15 MED ORDER — SODIUM CHLORIDE 0.9 % IV SOLN
INTRAVENOUS | Status: DC
  Filled 2020-11-15 (×2): qty 250

## 2020-11-15 NOTE — Progress Notes (Signed)
Patient only remained for 15 minutes post observation instead of 30 minutes. VSS stable at discharge and patient stable at discharge.

## 2020-11-15 NOTE — Patient Instructions (Signed)

## 2020-11-22 ENCOUNTER — Inpatient Hospital Stay: Payer: 59

## 2020-11-22 ENCOUNTER — Other Ambulatory Visit: Payer: Self-pay

## 2020-11-22 VITALS — BP 114/76 | HR 90 | Resp 18

## 2020-11-22 DIAGNOSIS — D500 Iron deficiency anemia secondary to blood loss (chronic): Secondary | ICD-10-CM | POA: Diagnosis not present

## 2020-11-22 DIAGNOSIS — D509 Iron deficiency anemia, unspecified: Secondary | ICD-10-CM

## 2020-11-22 MED ORDER — SODIUM CHLORIDE 0.9 % IV SOLN
INTRAVENOUS | Status: DC
  Filled 2020-11-22: qty 250

## 2020-11-22 MED ORDER — SODIUM CHLORIDE 0.9 % IV SOLN
750.0000 mg | Freq: Once | INTRAVENOUS | Status: AC
  Administered 2020-11-22: 750 mg via INTRAVENOUS
  Filled 2020-11-22: qty 15

## 2020-11-22 NOTE — Progress Notes (Signed)
Pt discharged in no apparent distress. Pt left ambulatory without assistance.  Pt aware of discharge instructions and verbalized understanding and had no further questions. Patient refused 30 minute post observation

## 2020-11-22 NOTE — Patient Instructions (Signed)

## 2020-12-20 ENCOUNTER — Other Ambulatory Visit: Payer: Self-pay | Admitting: Obstetrics

## 2020-12-20 DIAGNOSIS — N946 Dysmenorrhea, unspecified: Secondary | ICD-10-CM

## 2020-12-27 ENCOUNTER — Other Ambulatory Visit: Payer: Self-pay

## 2020-12-27 ENCOUNTER — Ambulatory Visit (INDEPENDENT_AMBULATORY_CARE_PROVIDER_SITE_OTHER): Payer: Medicaid Other

## 2020-12-27 ENCOUNTER — Other Ambulatory Visit (HOSPITAL_COMMUNITY)
Admission: RE | Admit: 2020-12-27 | Discharge: 2020-12-27 | Disposition: A | Discharge status: Home / Self Care | Payer: 59 | Source: Ambulatory Visit | Attending: Obstetrics | Admitting: Obstetrics

## 2020-12-27 VITALS — BP 113/77 | HR 80 | Wt 129.0 lb

## 2020-12-27 DIAGNOSIS — N898 Other specified noninflammatory disorders of vagina: Secondary | ICD-10-CM

## 2020-12-27 NOTE — Progress Notes (Addendum)
SUBJECTIVE:  40 y.o. female complains of malodorous vaginal discharge, itching for 2 day(s). Denies abnormal vaginal bleeding or significant pelvic pain or fever. No UTI symptoms. Denies history of known exposure to STD.  Patient's last menstrual period was 12/18/2020 (exact date).  OBJECTIVE:  She appears well, afebrile. Urine dipstick: not done.  ASSESSMENT:  Vaginal Discharge  Vaginal Odor Vaginal itching   PLAN:  GC, chlamydia, trichomonas, BVAG, CVAG probe sent to lab. Treatment: To be determined once lab results are received ROV prn if symptoms persist or worsen.  Patient was assessed and managed by nursing staff during this encounter. I have reviewed the chart and agree with the documentation and plan. I have also made any necessary editorial changes.  Coral Ceo, MD 12/27/2020 10:54 AM

## 2020-12-28 LAB — CERVICOVAGINAL ANCILLARY ONLY
Bacterial Vaginitis (gardnerella): POSITIVE — AB
Candida Glabrata: NEGATIVE
Candida Vaginitis: NEGATIVE
Chlamydia: NEGATIVE
Comment: NEGATIVE
Comment: NEGATIVE
Comment: NEGATIVE
Comment: NEGATIVE
Comment: NEGATIVE
Comment: NORMAL
Neisseria Gonorrhea: NEGATIVE
Trichomonas: NEGATIVE

## 2020-12-29 ENCOUNTER — Other Ambulatory Visit: Payer: Self-pay | Admitting: Obstetrics

## 2020-12-29 DIAGNOSIS — B9689 Other specified bacterial agents as the cause of diseases classified elsewhere: Secondary | ICD-10-CM

## 2020-12-29 DIAGNOSIS — N760 Acute vaginitis: Secondary | ICD-10-CM

## 2020-12-29 MED ORDER — METRONIDAZOLE 500 MG PO TABS: 500.0000 mg | ORAL_TABLET | Freq: Two times a day (BID) | ORAL | 2 refills | Status: DC

## 2021-01-01 NOTE — Progress Notes (Signed)
HEMATOLOGY/ONCOLOGY CONSULTATION NOTE  Date of Service: 04/16/18  Patient Care Team: Katrina James, MD as PCP - General (Family Medicine)  CHIEF COMPLAINTS/PURPOSE OF CONSULTATION:  Iron Deficiency Anemia  HISTORY OF PRESENTING ILLNESS:   Katrina Wiley is a wonderful 40 y.o. female who has been referred to [place] by Dr Katrina Wiley for evaluation and management of Iron Deficiency Anemia. The pt reports that she is doing well overall.   The pt reports that she has had four pregnancies and has three children, 47, 34 and 32 years old. She notes that she breast fed for 3 months each time. She notes that she had anemia in her pregnancies.  She notes that she had an endometrial ablation one year ago for her heavy periods which were 6 days in total and were heavy for 3 days, and occurred every 21 days. She denies blood transfusions or receiving IV iron in the past. She denies having fibroids at any time. She took PO iron pills, Ferralet, for a couple months and is not sure if this helped her lab work. She had some constipation with PO iron pills and is hesitant to restarting PO Iron. She denies being told of her anemia pre-puberty.   She notes that her periods are still frequent and are heavy for one day even after her endometrial ablation. She is taking oral contraceptives for cycle regulation.   She notes being very tired often. She notes that she has cravings for all purpose flour.   Her mom had a blood transfusion two years ago for her anemia, and her maternal family has sickle cell trait.    Most recent lab results (12/20/17) of CBC  is as follows: all values are WNL except for HGB at 10.5, HCT at 33.9, MCV at 68.2, MCH at 21.0, MCHC at 30.8, RDW at 18.5.  On review of systems, pt reports fatigue, pica, heavy periods, and denies nose bleeds, gum bleeds, other concerns for blood loss, leg swelling and any other symptoms.   On PMHx the pt reports iron deficiency anemia, sickle cell trait,  scoliosis. Endometrial ablation. She denies drug allergies, seasonal allergies, food allergies, bee stings.  On Social Hx the pt reports social ETOH use, and denies smoking. 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 On Family Hx the pt reports maternal sickle cell trait.   INTERVAL HISTORY  Katrina Wiley is a wonderful 40 y.o. female who is here today for evaluation and management of Iron Deficiency Anemia. The patient's last visit with [place] was on 04/16/2018. The pt reports that she is doing well overall.  The pt reports no new symptoms or concerns. Her energy is baseline and she is not craving ice. She currently is not taking OTC Iron. The pt notes her periods can be irregular and are heavy for the first few days, same as baseline and prior visit. She notes she has an appointment with her OBGYN this month, but has not been told an explanation of why they are so heavy.  Lab results today 01/03/2021 of CBC w/diff and CMP is as follows: all values are WNL except for RBC of 5.43, MCV of 76.6, MCH of 25.0, RDW of 19.8. 01/03/2021 Ferritin of 224. 01/03/2021 Iron of 116, Sat ratio of 44.  01/03/2021 Vit B12 in progress. 01/03/2021 Folate, Serum of 13.0.   On review of systems, pt denies fatigue, SOB, fevers, chills, night sweats, black/bloody stools, abdominal pain, leg swelling, changes in bowel habits, ice cravings  and any other symptoms.  MEDICAL HISTORY:  Past Medical History:  Diagnosis Date  . Abnormal vaginal Pap smear    cryo done in Dr Verdell Carmine office  . Allergic rhinitis due to allergen   . History of migraine headaches    Usually without aura  . Hyperlipidemia    no meds, diet controlled  . Iron deficiency anemia, unspecified    Iron deficiency; has required IV iron because she did not tolerate oral iron  . Preterm labor   . Sickle cell trait (HCC)   . SVD (spontaneous vaginal delivery)    x 3    SURGICAL HISTORY: Past Surgical History:  Procedure  Laterality Date  . DILATION AND CURETTAGE OF UTERUS    . DILITATION & CURRETTAGE/HYSTROSCOPY WITH HYDROTHERMAL ABLATION N/A 06/13/2017   Procedure: DILATATION & CURETTAGE/HYSTEROSCOPY WITH ATTEMPTED HYDROTHERMAL ABLATION;  Surgeon: Katrina Bossier, MD;  Location: [facility];  Service: Gynecology;  Laterality: N/A;  . INCISION AND DRAINAGE PERIRECTAL ABSCESS  2008  . TUBAL LIGATION    . WISDOM TOOTH EXTRACTION      SOCIAL HISTORY: Social History   Socioeconomic History  . Marital status: Single    Spouse name: Not on file  . Number of children: 3  . Years of education: Not on file  . Highest education level: Associate degree: occupational, Scientist, product/process development, or vocational program  Occupational History  . Occupation: Programmer, multimedia: [employer]  Tobacco Use  . Smoking status: Never Smoker  . Smokeless tobacco: Never Used  Vaping Use  . Vaping Use: Never used  Substance and Sexual Activity  . Alcohol use: Yes    Alcohol/week: 0.0 standard drinks    Comment: occasional  . Drug use: No  . Sexual activity: Yes    Partners: Male    Birth control/protection: Condom, Surgical  Other Topics Concern  . Not on file  Social History Narrative   Single mother of 3 daughters: Katrina Wiley (32), Katrina Wiley (29), Katrina Wiley (8)   Never smoked.  Rare wine".   No recreational drugs.   Diet: Eats all types of foods except beef and pork.   Exercise: No set routine, but intermittent walking.   Religion: [facility], enjoys traveling.   Social Determinants of Health   Financial Resource Strain: Not on file  Food Insecurity: Not on file  Transportation Needs: Not on file  Physical Activity: Not on file  Stress: Not on file  Social Connections: Not on file  Intimate Partner Violence: Not on file    FAMILY HISTORY: Family History  Problem Relation Age of Onset  . Anemia Mother   . Hypertension Father   . Hypertension Maternal Grandmother   . Hyperlipidemia  Maternal Grandmother   . Stroke Maternal Grandmother   . Hypertension Maternal Grandfather   . Hyperlipidemia Maternal Grandfather   . Diabetes Paternal Grandmother   . Cirrhosis Paternal Grandfather     ALLERGIES:  has No Known Allergies.  MEDICATIONS:  Current Outpatient Medications  Medication Sig Dispense Refill  . fluticasone (FLONASE) 50 MCG/ACT nasal spray Place into both nostrils.    . folic acid (FOLVITE) 1 MG tablet Take 2 tablets (2 mg total) by mouth daily. 60 tablet 11  . ibuprofen (ADVIL) 800 MG tablet TAKE 1 TABLET BY MOUTH EVERY 8 HOURS AS NEEDED 30 tablet 5  . metroNIDAZOLE (FLAGYL) 500 MG tablet Take 1 tablet (500 mg total) by mouth 2 (two) times daily. 14 tablet  2  . spironolactone (ALDACTONE) 25 MG tablet Take 25 mg by mouth 2 (two) times daily.    . SUMAtriptan (IMITREX) 50 MG tablet Take by mouth.     No current facility-administered medications for this visit.    REVIEW OF SYSTEMS:   10 Point review of Systems was done is negative except as noted above.  PHYSICAL EXAMINATION: Vitals:   01/03/21 0910  BP: 119/85  Pulse: 77  Resp: 18  Temp: 98.1 F (36.7 C)  SpO2: 100%   Filed Weights   01/03/21 0910  Weight: 129 lb 12.8 oz (58.9 kg)   .Body mass index is 22.99 kg/m.   GENERAL:alert, in no acute distress and comfortable SKIN: no acute rashes, no significant lesions EYES: conjunctiva are pink and non-injected, sclera anicteric OROPHARYNX: MMM, no exudates, no oropharyngeal erythema or ulceration NECK: supple, no JVD LYMPH:  no palpable lymphadenopathy in the cervical, axillary or inguinal regions LUNGS: clear to auscultation b/l with normal respiratory effort HEART: regular rate & rhythm ABDOMEN:  normoactive bowel sounds , non tender, not distended. Extremity: no pedal edema PSYCH: alert & oriented x 3 with fluent speech NEURO: no focal motor/sensory deficits  LABORATORY DATA:  I have reviewed the data as listed  CBC Latest Ref Rng &  Units 01/03/2021 11/13/2018 04/16/2018  WBC 4.0 - 10.5 K/uL 4.2 5.6 4.4  Hemoglobin 12.0 - 15.0 g/dL 42.7 06.2 11.0(L)  Hematocrit 36.0 - 46.0 % 41.6 37.1 35.1  Platelets 150 - 400 K/uL 214 194 249   . CBC    Component Value Date/Time   WBC 4.2 01/03/2021 0849   WBC 5.6 11/13/2018 2152   RBC 5.43 (H) 01/03/2021 0849   HGB 13.6 01/03/2021 0849   HCT 41.6 01/03/2021 0849   PLT 214 01/03/2021 0849   MCV 76.6 (L) 01/03/2021 0849   MCH 25.0 (L) 01/03/2021 0849   MCHC 32.7 01/03/2021 0849   RDW 19.8 (H) 01/03/2021 0849   LYMPHSABS 1.2 01/03/2021 0849   MONOABS 0.2 01/03/2021 0849   EOSABS 0.0 01/03/2021 0849   BASOSABS 0.0 01/03/2021 0849    . CMP Latest Ref Rng & Units 04/16/2018 11/10/2017 01/21/2017  Glucose 70 - 140 mg/dL 75 82 376(E)  BUN 7 - 26 mg/dL 11 9 14   Creatinine 0.60 - 1.10 mg/dL 8.31 5.17 6.16  Sodium 136 - 145 mmol/L 139 137 140  Potassium 3.5 - 5.1 mmol/L 3.5 3.6 3.2(L)  Chloride 98 - 109 mmol/L 105 104 107  CO2 22 - 29 mmol/L 27 26 26   Calcium 8.4 - 10.4 mg/dL 9.7 9.3 9.3  Total Protein 6.4 - 8.3 g/dL 0.7(P) - 7.6  Total Bilirubin 0.2 - 1.2 mg/dL 0.5 - 0.5  Alkaline Phos 40 - 150 U/L 50 - 46  AST 5 - 34 U/L 17 - 17  ALT 0 - 55 U/L 13 - 9(L)   . Lab Results  Component Value Date   IRON 116 01/03/2021   TIBC 264 01/03/2021   IRONPCTSAT 44 01/03/2021   (Iron and TIBC)  Lab Results  Component Value Date   FERRITIN 224 01/03/2021    . Lab Results  Component Value Date   IRON 27 (L) 04/16/2018   TIBC 486 (H) 04/16/2018   IRONPCTSAT 6 (L) 04/16/2018   (Iron and TIBC)  Lab Results  Component Value Date   FERRITIN <4 (L) 04/16/2018      12/24/17 CBC:    RADIOGRAPHIC STUDIES: I have personally reviewed the radiological images as listed and  agreed with the findings in the report. No results found.  ASSESSMENT & PLAN:  40 y.o. female with  1.Microcytic Anemia due to severe iron deficiency 2. Severe irion deficiency due to heavy menstrual  losses. Not responsive to PO iron replacement 3. Pica symptoms related to Iron deficiency anemia - resolved 4. Sickle Cell trait  PLAN -Discussed patient's most recent labs from today, 01/03/2021; Hgb normal, not anemi -Recommended pt f/u w OBGYN regarding evaluation and mx of her heavy periods -Advised pt we will seek IV Iron again if levels low. The pt's iron is normal and no need at this time. -If labs stable, will f/u w PCP and OBGYN. If not, continue visits to clinic.  -Advised pt that those with Sickle Cell trait can have invisible loss of blood in urine.  -Will see back in 6 weeks with labs.   FOLLOW UP: RTC with Dr Candise Che in 6 months with labs   All of the patients questions were answered with apparent satisfaction. The patient knows to call the clinic with any problems, questions or concerns.  The total time spent in the appointment was 20 minutes and more than 50% was on counseling and direct patient cares.  Wyvonnia Lora MD MS AAHIVMS [facility] Hematology/Oncology Physician [facility]  (Office):       [phone] (Work cell):  [phone] (Fax):           [phone]  01/02/2021 9:00 AM  I, Minda Meo, am acting as scribe for Dr. Wyvonnia Lora, MD.  .I have reviewed the above documentation for accuracy and completeness, and I agree with the above. Johney Maine MD

## 2021-01-03 ENCOUNTER — Inpatient Hospital Stay: Payer: 59 | Attending: Physician Assistant | Admitting: Hematology

## 2021-01-03 ENCOUNTER — Inpatient Hospital Stay: Payer: 59

## 2021-01-03 ENCOUNTER — Other Ambulatory Visit: Payer: Self-pay

## 2021-01-03 VITALS — BP 119/85 | HR 77 | Temp 98.1°F | Resp 18 | Ht 63.0 in | Wt 129.8 lb

## 2021-01-03 DIAGNOSIS — D500 Iron deficiency anemia secondary to blood loss (chronic): Secondary | ICD-10-CM | POA: Diagnosis present

## 2021-01-03 DIAGNOSIS — D573 Sickle-cell trait: Secondary | ICD-10-CM | POA: Insufficient documentation

## 2021-01-03 DIAGNOSIS — D509 Iron deficiency anemia, unspecified: Secondary | ICD-10-CM

## 2021-01-03 DIAGNOSIS — N920 Excessive and frequent menstruation with regular cycle: Secondary | ICD-10-CM | POA: Diagnosis not present

## 2021-01-03 LAB — CMP (CANCER CENTER ONLY)
ALT: 20 U/L (ref 0–44)
AST: 17 U/L (ref 15–41)
Albumin: 4.6 g/dL (ref 3.5–5.0)
Alkaline Phosphatase: 66 U/L (ref 38–126)
Anion gap: 7 (ref 5–15)
BUN: 10 mg/dL (ref 6–20)
CO2: 24 mmol/L (ref 22–32)
Calcium: 9.4 mg/dL (ref 8.9–10.3)
Chloride: 108 mmol/L (ref 98–111)
Creatinine: 0.77 mg/dL (ref 0.44–1.00)
GFR, Estimated: >60 mL/min (ref >60)
Glucose, Bld: 82 mg/dL (ref 70–99)
Potassium: 3.9 mmol/L (ref 3.5–5.1)
Sodium: 139 mmol/L (ref 135–145)
Total Bilirubin: 0.6 mg/dL (ref 0.3–1.2)
Total Protein: 7.9 g/dL (ref 6.5–8.1)

## 2021-01-03 LAB — CBC WITH DIFFERENTIAL (CANCER CENTER ONLY)
Abs Immature Granulocytes: 0.01 10*3/uL (ref 0.00–0.07)
Basophils Absolute: 0 10*3/uL (ref 0.0–0.1)
Basophils Relative: 1 %
Eosinophils Absolute: 0 10*3/uL (ref 0.0–0.5)
Eosinophils Relative: 1 %
HCT: 41.6 % (ref 36.0–46.0)
Hemoglobin: 13.6 g/dL (ref 12.0–15.0)
Immature Granulocytes: 0 %
Lymphocytes Relative: 28 %
Lymphs Abs: 1.2 10*3/uL (ref 0.7–4.0)
MCH: 25 pg — ABNORMAL LOW (ref 26.0–34.0)
MCHC: 32.7 g/dL (ref 30.0–36.0)
MCV: 76.6 fL — ABNORMAL LOW (ref 80.0–100.0)
Monocytes Absolute: 0.2 10*3/uL (ref 0.1–1.0)
Monocytes Relative: 5 %
Neutro Abs: 2.7 10*3/uL (ref 1.7–7.7)
Neutrophils Relative %: 65 %
Platelet Count: 214 10*3/uL (ref 150–400)
RBC: 5.43 MIL/uL — ABNORMAL HIGH (ref 3.87–5.11)
RDW: 19.8 % — ABNORMAL HIGH (ref 11.5–15.5)
WBC Count: 4.2 10*3/uL (ref 4.0–10.5)
nRBC: 0 % (ref 0.0–0.2)

## 2021-01-03 LAB — FERRITIN: Ferritin: 224 ng/mL (ref 11–307)

## 2021-01-03 LAB — VITAMIN B12: Vitamin B-12: 375 pg/mL (ref 180–914)

## 2021-01-03 LAB — IRON AND TIBC
Iron: 116 ug/dL (ref 41–142)
Saturation Ratios: 44 % (ref 21–57)
TIBC: 264 ug/dL (ref 236–444)
UIBC: 148 ug/dL (ref 120–384)

## 2021-01-03 LAB — FOLATE: Folate: 13 ng/mL (ref >5.9)

## 2021-01-03 NOTE — Patient Instructions (Signed)
Thank you for choosing [facility] to provide your oncology and hematology care.   Should you have questions after your visit to the [facility] ([facility]), please contact this office at [phone] between 8:30 AM and 4:30 PM.  Voice mails left after 4:00 PM may not be returned until the following business day.  Calls received after 4:30 PM will be answered by an off-site Nurse Triage Line.    Prescription Refills:  Please have your pharmacy contact us directly for most prescription requests.  Contact the office directly for refills of narcotics (pain medications). Allow 48-72 hours for refills.  Appointments: Please contact the [facility] scheduling department [phone] for questions regarding [facility] appointment scheduling.  Contact the schedulers with any scheduling changes so that your appointment can be rescheduled in a timely manner.   Central Scheduling for [place] [phone] - Call to schedule procedures such as PET scans, CT scans, MRI, Ultrasound, etc.  To afford each patient quality time with our providers, please arrive 30 minutes before your scheduled appointment time.  If you arrive late for your appointment, you may be asked to reschedule.  We strive to give you quality time with our providers, and arriving late affects you and other patients whose appointments are after yours. If you are a no show for multiple scheduled visits, you may be dismissed from the clinic at the providers discretion.     Resources: [facility] Social Workers [phone] for additional information on assistance programs or assistance connecting with community support programs   [place] DSS  [phone]: Information regarding food stamps, Medicaid, and utility assistance [place] [employer] [place] [phone]   [place] [employer]'s shared-ride transportation service for eligible riders who have a disability that prevents them from riding the fixed route bus.   Medicare  Rights Center 800-333-4114 Helps people with Medicare understand their rights and benefits, navigate the Medicare system, and secure the quality healthcare they deserve [employer] 800-227-2345 Assists patients locate various types of support and financial assistance Cancer Care: 1-800-813-HOPE (4673) Provides financial assistance, online support groups, medication/co-pay assistance.   Transportation Assistance for appointments at [facility]: Transportation Coordinator [phone]  Again, thank you for choosing [facility] for your care.       

## 2021-01-10 ENCOUNTER — Ambulatory Visit: Payer: Medicaid Other | Admitting: Obstetrics

## 2021-01-24 ENCOUNTER — Encounter: Payer: Self-pay | Admitting: Obstetrics

## 2021-01-24 ENCOUNTER — Other Ambulatory Visit: Payer: Self-pay

## 2021-01-24 ENCOUNTER — Ambulatory Visit (INDEPENDENT_AMBULATORY_CARE_PROVIDER_SITE_OTHER): Payer: 59 | Admitting: Obstetrics

## 2021-01-24 ENCOUNTER — Other Ambulatory Visit: Payer: Self-pay | Admitting: Obstetrics

## 2021-01-24 ENCOUNTER — Other Ambulatory Visit (HOSPITAL_COMMUNITY)
Admission: RE | Admit: 2021-01-24 | Discharge: 2021-01-24 | Disposition: A | Discharge status: Home / Self Care | Payer: 59 | Source: Ambulatory Visit | Attending: Obstetrics | Admitting: Obstetrics

## 2021-01-24 VITALS — BP 117/74 | HR 88 | Ht 63.0 in | Wt 129.0 lb

## 2021-01-24 DIAGNOSIS — Z01419 Encounter for gynecological examination (general) (routine) without abnormal findings: Secondary | ICD-10-CM

## 2021-01-24 DIAGNOSIS — D500 Iron deficiency anemia secondary to blood loss (chronic): Secondary | ICD-10-CM

## 2021-01-24 DIAGNOSIS — Z113 Encounter for screening for infections with a predominantly sexual mode of transmission: Secondary | ICD-10-CM | POA: Diagnosis not present

## 2021-01-24 DIAGNOSIS — Z3009 Encounter for other general counseling and advice on contraception: Secondary | ICD-10-CM

## 2021-01-24 DIAGNOSIS — N898 Other specified noninflammatory disorders of vagina: Secondary | ICD-10-CM | POA: Insufficient documentation

## 2021-01-24 DIAGNOSIS — N6320 Unspecified lump in the left breast, unspecified quadrant: Secondary | ICD-10-CM

## 2021-01-24 DIAGNOSIS — N939 Abnormal uterine and vaginal bleeding, unspecified: Secondary | ICD-10-CM | POA: Diagnosis not present

## 2021-01-24 DIAGNOSIS — K5901 Slow transit constipation: Secondary | ICD-10-CM

## 2021-01-24 DIAGNOSIS — N644 Mastodynia: Secondary | ICD-10-CM

## 2021-01-24 MED ORDER — CITRANATAL 90 DHA 90-1 & 300 MG PO MISC: 1.0000 | Freq: Every day | ORAL | 11 refills | Status: DC

## 2021-01-24 MED ORDER — DOCUSATE SODIUM 100 MG PO CAPS: 100.0000 mg | ORAL_CAPSULE | Freq: Two times a day (BID) | ORAL | 11 refills | Status: DC

## 2021-01-24 MED ORDER — SLYND 4 MG PO TABS: 1.0000 | ORAL_TABLET | Freq: Every day | ORAL | 11 refills | Status: DC

## 2021-01-24 MED ORDER — CITRANATAL BLOOM 90-1 MG PO TABS
1.0000 | ORAL_TABLET | Freq: Every day | ORAL | 11 refills | Status: DC
Start: 2021-01-24 — End: 2021-01-24

## 2021-01-24 NOTE — Progress Notes (Signed)
Subjective:        Katrina Wiley is a 40 y.o. female here for a routine exam.  Current complaints: Pain and mass in left breast.  Also has heavy 5-6 day periods, which required an iron transfusion last month because of iron deficiency anemia.  Personal health questionnaire:  Is patient [name], have a family history of breast and/or ovarian cancer: yes Is there a family history of uterine cancer diagnosed at age < 18, gastrointestinal cancer, urinary tract cancer, family member who is a Personnel officer syndrome-associated carrier: no Is the patient overweight and hypertensive, family history of diabetes, personal history of gestational diabetes, preeclampsia or PCOS: no Is patient over 19, have PCOS,  family history of premature CHD under age 31, diabetes, smoke, have hypertension or peripheral artery disease:  no At any time, has a partner hit, kicked or otherwise hurt or frightened you?: no Over the past 2 weeks, have you felt down, depressed or hopeless?: no Over the past 2 weeks, have you felt little interest or pleasure in doing things?:no   Gynecologic History Patient's last menstrual period was 01/17/2021 (approximate). Contraception: tubal ligation Last Pap: 12-04-2019. Results were: normal Last mammogram: n/a. Results were: n/a  Obstetric History OB History  Gravida Para Term Preterm AB Living  4 3 2 1 1 2   SAB IAB Ectopic Multiple Live Births    1          # Outcome Date GA Lbr Len/2nd Weight Sex Delivery Anes PTL Lv  4 Term      Vag-Spont        Birth Comments: System Generated. Please review and update pregnancy details.  3 IAB           2 Preterm      Vag-Spont     1 Term      Vag-Spont       Past Medical History:  Diagnosis Date  . Abnormal vaginal Pap smear    cryo done in Dr office  . Allergic rhinitis due to allergen   . History of migraine headaches    Usually without aura  . Hyperlipidemia    no meds, diet controlled  . Iron deficiency  anemia, unspecified    Iron deficiency; has required IV iron because she did not tolerate oral iron  . Preterm labor   . Sickle cell trait (HCC)   . SVD (spontaneous vaginal delivery)    x 3    Past Surgical History:  Procedure Laterality Date  . DILATION AND CURETTAGE OF UTERUS    . DILITATION & CURRETTAGE/HYSTROSCOPY WITH HYDROTHERMAL ABLATION N/A 06/13/2017   Procedure: DILATATION & CURETTAGE/HYSTEROSCOPY WITH ATTEMPTED HYDROTHERMAL ABLATION;  Surgeon: 06/15/2017, MD;  Location: [facility];  Service: Gynecology;  Laterality: N/A;  . INCISION AND DRAINAGE PERIRECTAL ABSCESS  2008  . TUBAL LIGATION    . WISDOM TOOTH EXTRACTION       Current Outpatient Medications:  .  docusate sodium (COLACE) 100 MG capsule, Take 1 capsule (100 mg total) by mouth 2 (two) times daily., Disp: 60 capsule, Rfl: 11 .  Drospirenone (SLYND) 4 MG TABS, Take 1 tablet by mouth daily., Disp: 28 tablet, Rfl: 11 .  fluticasone (FLONASE) 50 MCG/ACT nasal spray, Place into both nostrils., Disp: , Rfl:  .  folic acid (FOLVITE) 1 MG tablet, Take 2 tablets (2 mg total) by mouth daily., Disp: 60 tablet, Rfl: 11 .  ibuprofen (ADVIL) 800 MG tablet, TAKE 1 TABLET BY MOUTH  EVERY 8 HOURS AS NEEDED (Patient not taking: Reported on 01/24/2021), Disp: 30 tablet, Rfl: 5 .  metroNIDAZOLE (FLAGYL) 500 MG tablet, Take 1 tablet (500 mg total) by mouth 2 (two) times daily. (Patient not taking: Reported on 01/24/2021), Disp: 14 tablet, Rfl: 2 .  Prenat w/o A-FeCbGl-DSS-FA-DHA (CITRANATAL 90 DHA) 90-1 & 300 MG MISC, Take 1 tablet by mouth daily before breakfast., Disp: 60 each, Rfl: 11 .  spironolactone (ALDACTONE) 25 MG tablet, Take 25 mg by mouth 2 (two) times daily., Disp: , Rfl:  .  SUMAtriptan (IMITREX) 50 MG tablet, Take by mouth., Disp: , Rfl:  No Known Allergies  Social History   Tobacco Use  . Smoking status: Never Smoker  . Smokeless tobacco: Never Used  Substance Use Topics  . Alcohol use: Yes    Alcohol/week: 0.0 standard  drinks    Comment: occasional    Family History  Problem Relation Age of Onset  . Anemia Mother   . Hypertension Father   . Hypertension Maternal Grandmother   . Hyperlipidemia Maternal Grandmother   . Stroke Maternal Grandmother   . Hypertension Maternal Grandfather   . Hyperlipidemia Maternal Grandfather   . Diabetes Paternal Grandmother   . Cirrhosis Paternal Grandfather       Review of Systems  Constitutional: negative for fatigue and weight loss Respiratory: negative for cough and wheezing Cardiovascular: negative for chest pain, fatigue and palpitations Gastrointestinal: negative for abdominal pain and change in bowel habits Musculoskeletal:negative for myalgias Neurological: negative for gait problems and tremors Behavioral/Psych: negative for abusive relationship, depression Endocrine: negative for temperature intolerance    Genitourinary:positive for abnormally heavy menstrual periods and vaginal discharge.  Negative for genital lesions, hot flashes, and sexual problems.  Integument/breast: positive for breast lump and breast tenderness.  Negative for nipple discharge and skin lesion(s)    Objective:       BP 117/74   Pulse 88   Ht 5\' 3"  (1.6 m)   Wt 129 lb (58.5 kg)   LMP 01/17/2021 (Approximate)   BMI 22.85 kg/m  General:   alert and no distress  Skin:   no rash or abnormalities  Lungs:   clear to auscultation bilaterally  Heart:   regular rate and rhythm, S1, S2 normal, no murmur, click, rub or gallop  Breasts:   left breast: mass at 12 o'clock, soft, mobile, tender  Abdomen:  normal findings: no organomegaly, soft, non-tender and no hernia  Pelvis:  External genitalia: normal general appearance Urinary system: urethral meatus normal and bladder without fullness, nontender Vaginal: normal without tenderness, induration or masses Cervix: normal appearance Adnexa: normal bimanual exam Uterus: anteverted and non-tender, normal size   Lab Review Urine  pregnancy test Labs reviewed yes Radiologic studies reviewed no  50% of 20 min visit spent on counseling and coordination of care.   Assessment:     1. Encounter for gynecological examination with Papanicolaou smear of cervix Rx: - Cytology - PAP( [place])  2. Vaginal discharge Rx: - Cervicovaginal ancillary only( [place])  3. Screening for STD (sexually transmitted disease) Rx: - Hepatitis B surface antigen - Hepatitis C antibody - HIV Antibody (routine testing w rflx) - RPR  4. Abnormal uterine bleeding (AUB) - heavy 5-6 day periods resulting in anemia, which required an iron transfusion a month ago Rx: - Drospirenone (SLYND) 4 MG TABS; Take 1 tablet by mouth daily.  Dispense: 28 tablet; Refill: 11  5. Iron deficiency anemia due to chronic blood loss - Citranatal  90 Rx  6. Encounter for counseling regarding contraception - has had a tubal, but is now considering tubal reversal  7. Breast mass, left Rx: - US BREAST LTD UNI LEFT INC AXILLA; Future - MM DIAG BREAST TOMO BILATERAL; Future  8. Breast pain, left  9. Constipation by delayed colonic transit Rx: - docusate sodium (COLACE) 100 MG capsule; Take 1 capsule (100 mg total) by mouth 2 (two) times daily.  Dispense: 60 capsule; Refill: 11    Plan:  Follow up in 3 weeks   Education reviewed: calcium supplements, depression evaluation, low fat, low cholesterol diet, safe sex/STD prevention, self breast exams and weight bearing exercise. Mammogram ordered.   Meds ordered this encounter  Medications  . DISCONTD: Prenatal-DSS-FeCb-FeGl-FA (CITRANATAL BLOOM) 90-1 MG TABS    Sig: Take 1 tablet by mouth daily before breakfast.    Dispense:  30 tablet    Refill:  11  . docusate sodium (COLACE) 100 MG capsule    Sig: Take 1 capsule (100 mg total) by mouth 2 (two) times daily.    Dispense:  60 capsule    Refill:  11  . Drospirenone (SLYND) 4 MG TABS    Sig: Take 1 tablet by mouth daily.    Dispense:  28  tablet    Refill:  11   Orders Placed This Encounter  Procedures  . US BREAST LTD UNI LEFT INC AXILLA    INS HEALTH[facility] BLUE MCD PF NONE ORDER Epic/CO-SIGN REQ Breast pain and mass in left breast at 12 o'clock, outer quadrant. SX NO/FAMILY HX BREAST CA YES /NO  IMPLANTS/ NO NEEDS COVID VAX 2ND 02/2020 BOOSTER ? LC/  ANITRA @ [facility]     Standing Status:   Future    Standing Expiration Date:   01/24/2022    Order Specific Question:   Reason for Exam (SYMPTOM  OR DIAGNOSIS REQUIRED)    Answer:   Breast pain and mass in left breast at 12 o'clock    Order Specific Question:   Preferred imaging location?    Answer:   [facility]  . MM DIAG BREAST TOMO BILATERAL    INS HEALTHY BLUE MCD PF NONE ORDER Epic/CO-SIGN REQ Breast pain and mass in left breast at 12 o'clock, outer quadrant. SX NO/FAMILY HX BREAST CA YES /NO  IMPLANTS/ NO NEEDS COVID VAX 2ND 02/2020 BOOSTER ? LC/  ANITRA @ [facility]    Standing Status:   Future    Standing Expiration Date:   01/24/2022    Order Specific Question:   Reason for Exam (SYMPTOM  OR DIAGNOSIS REQUIRED)    Answer:   Breast pain and mass in left breast at 12 o'clock, outer quadrant.    Order Specific Question:   Is the patient pregnant?    Answer:   No    Order Specific Question:   Preferred imaging location?    Answer:   [facility]  . Hepatitis B surface antigen  . Hepatitis C antibody  . HIV Antibody (routine testing w rflx)  . RPR    Brock Bad, MD 01/24/2021 1:57 PM

## 2021-01-24 NOTE — Progress Notes (Signed)
Patient presents for Annual Exam  LMP: 01/17/21 last 5-6 days , heavy first few days then moderate  Last Pap:12/04/2019 STD Screening: pt just had swab done on 12/27/2020 +BV wants Full Panel and swab repeated   Family Hx of Breast Cancer: Maternal G.Aunt    CC: pt wants to discuss Tubal reversal. Had BTL done in [place] in 2013. Pt notes pain in Left breast for the past week.  Pt Iron inFusion in 11/2020. Needs to discuss regulating cycle  Last had unprotected intercourse x 1 wk ago.

## 2021-01-25 LAB — CERVICOVAGINAL ANCILLARY ONLY
Bacterial Vaginitis (gardnerella): NEGATIVE
Candida Glabrata: NEGATIVE
Candida Vaginitis: POSITIVE — AB
Chlamydia: NEGATIVE
Comment: NEGATIVE
Comment: NEGATIVE
Comment: NEGATIVE
Comment: NEGATIVE
Comment: NEGATIVE
Comment: NORMAL
Neisseria Gonorrhea: NEGATIVE
Trichomonas: NEGATIVE

## 2021-01-25 LAB — HEPATITIS C ANTIBODY: Hep C Virus Ab: 0.1 s/co ratio (ref 0.0–0.9)

## 2021-01-25 LAB — HIV ANTIBODY (ROUTINE TESTING W REFLEX): HIV Screen 4th Generation wRfx: NONREACTIVE

## 2021-01-25 LAB — RPR: RPR Ser Ql: NONREACTIVE

## 2021-01-25 LAB — HEPATITIS B SURFACE ANTIGEN: Hepatitis B Surface Ag: NEGATIVE

## 2021-01-26 ENCOUNTER — Other Ambulatory Visit: Payer: Self-pay | Admitting: Obstetrics

## 2021-01-26 DIAGNOSIS — B373 Candidiasis of vulva and vagina: Secondary | ICD-10-CM

## 2021-01-26 DIAGNOSIS — D500 Iron deficiency anemia secondary to blood loss (chronic): Secondary | ICD-10-CM

## 2021-01-26 DIAGNOSIS — B3731 Acute candidiasis of vulva and vagina: Secondary | ICD-10-CM

## 2021-01-26 LAB — CYTOLOGY - PAP
Comment: NEGATIVE
Diagnosis: NEGATIVE
High risk HPV: NEGATIVE

## 2021-01-26 MED ORDER — FLUCONAZOLE 150 MG PO TABS: 150.0000 mg | ORAL_TABLET | Freq: Once | ORAL | 0 refills | Status: AC

## 2021-02-22 ENCOUNTER — Ambulatory Visit
Admission: RE | Admit: 2021-02-22 | Discharge: 2021-02-22 | Disposition: A | Discharge status: Home / Self Care | Payer: Medicaid Other | Source: Ambulatory Visit | Attending: Obstetrics | Admitting: Obstetrics

## 2021-02-22 ENCOUNTER — Other Ambulatory Visit: Payer: Self-pay

## 2021-02-22 DIAGNOSIS — N6320 Unspecified lump in the left breast, unspecified quadrant: Secondary | ICD-10-CM

## 2021-03-07 ENCOUNTER — Other Ambulatory Visit: Payer: Self-pay

## 2021-03-07 MED ORDER — FLUCONAZOLE 150 MG PO TABS: 150.0000 mg | ORAL_TABLET | Freq: Once | ORAL | 0 refills | Status: AC

## 2021-03-07 NOTE — Telephone Encounter (Signed)
Refill on diflucan for yeast infection.   

## 2021-03-08 ENCOUNTER — Other Ambulatory Visit: Payer: Medicaid Other

## 2021-04-24 IMAGING — MG DIGITAL DIAGNOSTIC BILAT W/ TOMO W/ CAD
6 of 11 series · 6 of 31 positions shown · non-contrast
Comparison: None.

CLINICAL DATA: 39-year-old female with a palpable left breast lump.

EXAM:
DIGITAL DIAGNOSTIC BILATERAL MAMMOGRAM WITH TOMOSYNTHESIS AND CAD;
ULTRASOUND LEFT BREAST LIMITED
TECHNIQUE: Bilateral digital diagnostic mammography and breast tomosynthesis
was performed. The images were evaluated with computer-aided
detection.; Targeted ultrasound examination of the left breast was
performed

[L TAN]
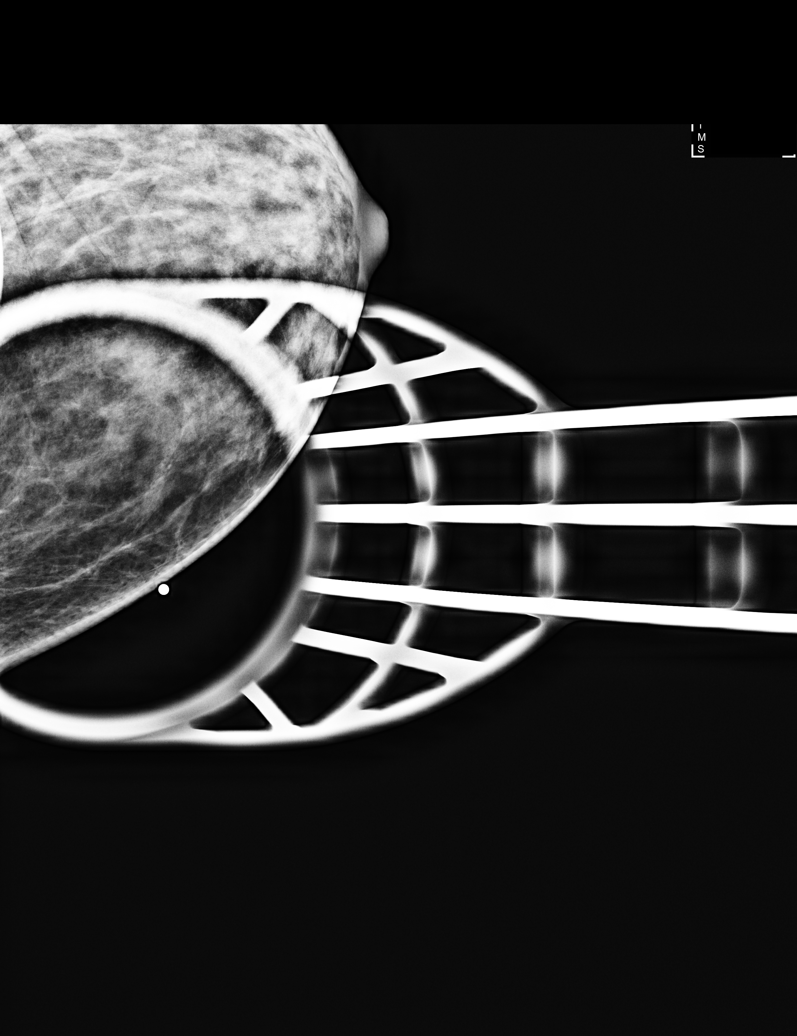

[L CC synth-2D (1 of 2)]
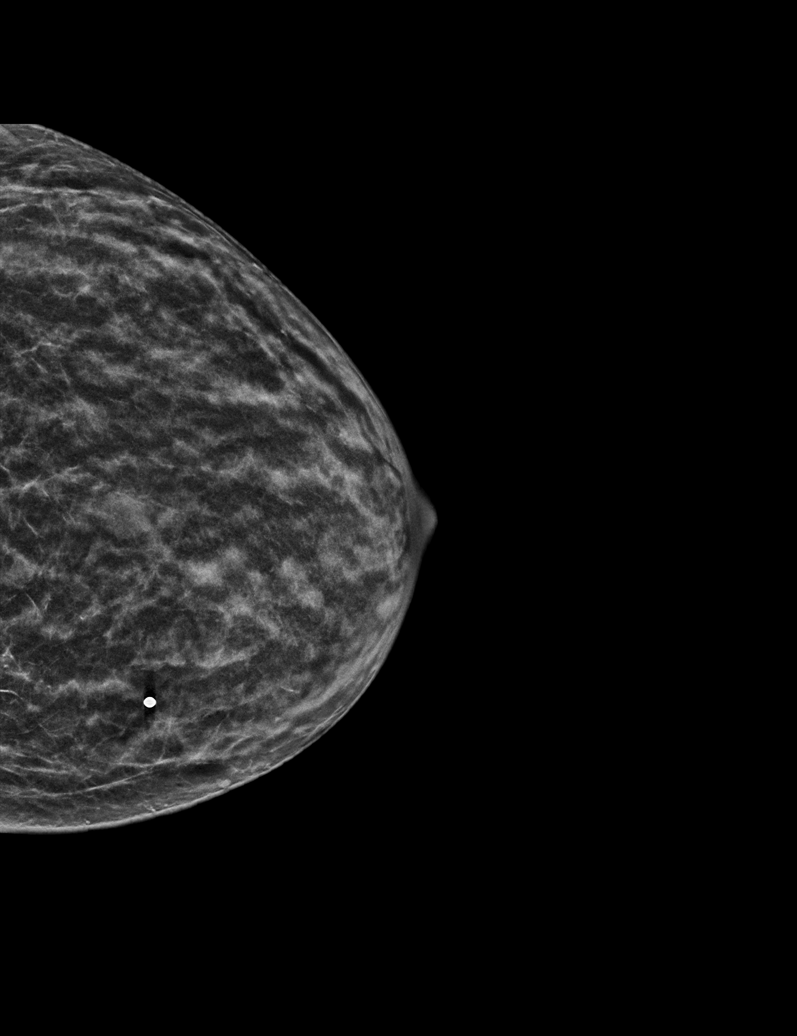

[L MLO synth-2D]
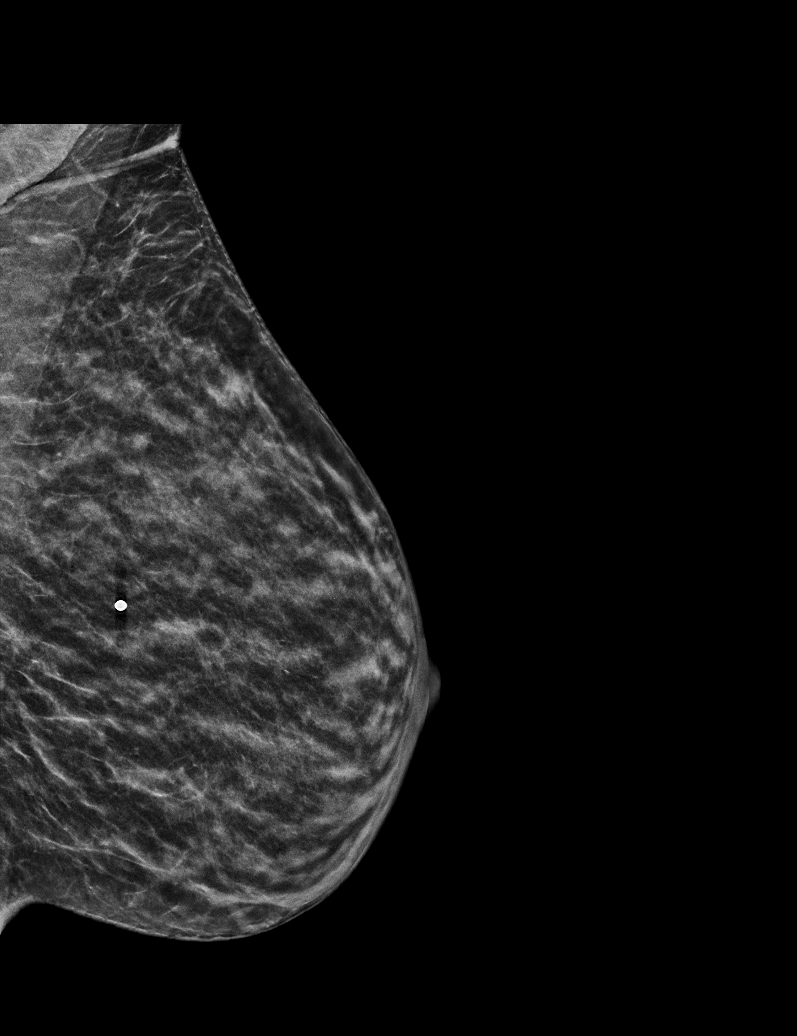

[R MLO synth-2D]
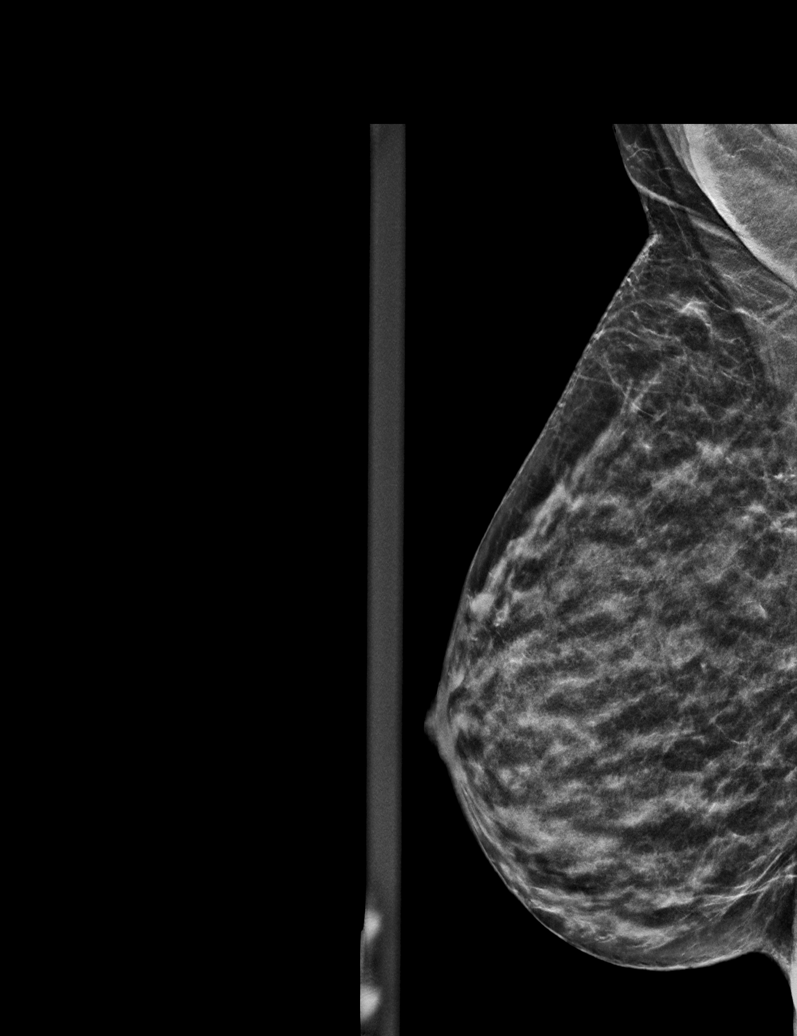

[L CC synth-2D (2 of 2)]
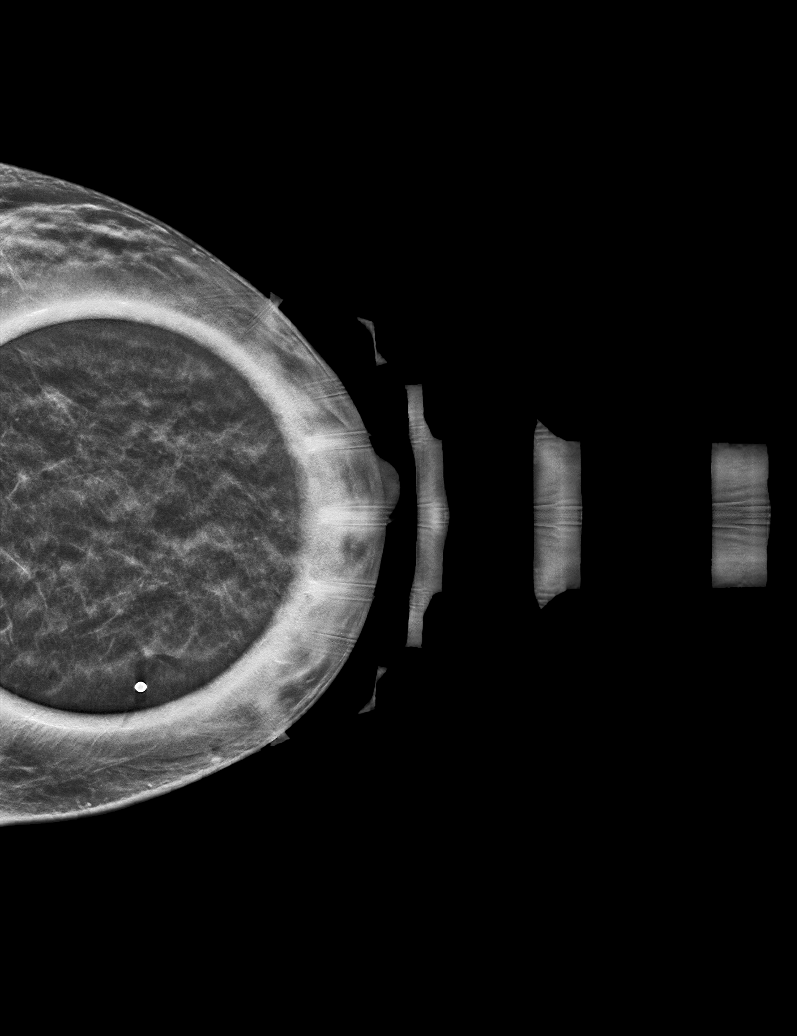

[R CC synth-2D]
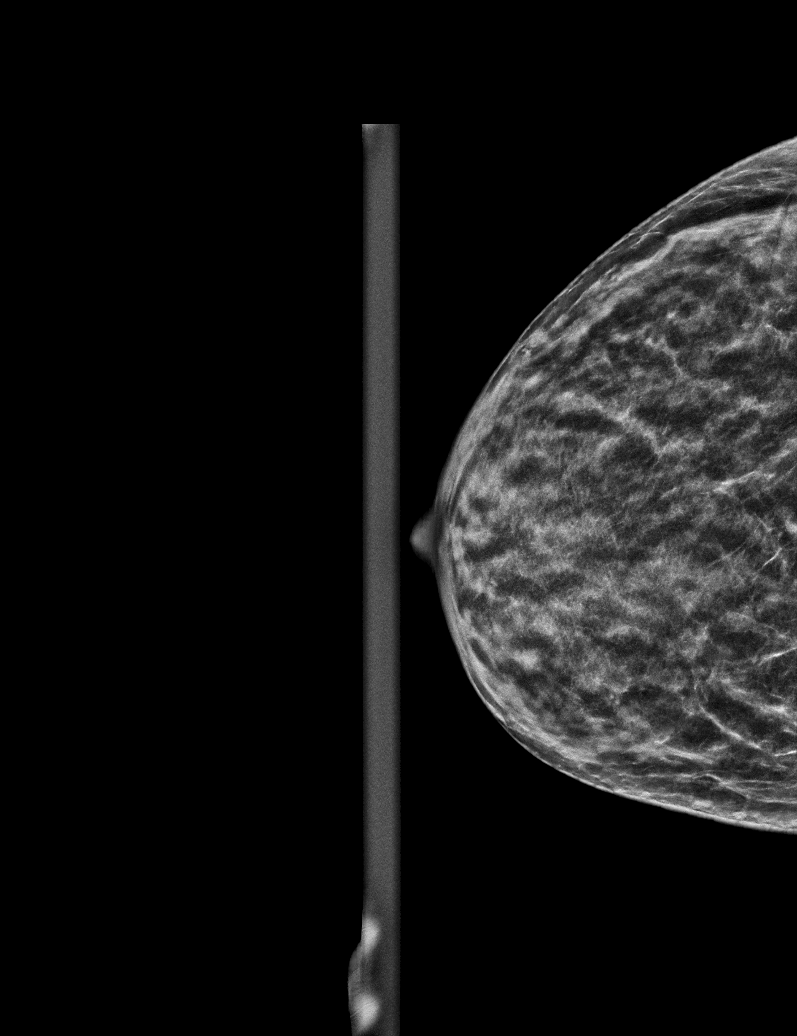

[6 of 31 positions shown; findings below may reference images not displayed]

ACR Breast Density Category c: The breast tissue is heterogeneously
dense, which may obscure small masses.
FINDINGS: A radiopaque BB was placed at the site of the patient's palpable
lump in the upper inner left breast. No focal or suspicious
mammographic findings are seen deep to the radiopaque BB. An oval,
circumscribed equal density mass is seen in the central left breast
at mid to posterior depth on the cc projection. It localizes
slightly superiorly on tomosynthesis.

No additional suspicious findings in the remainder of either breast.

Targeted ultrasound is performed, showing normal fibroglandular
tissue without focal or suspicious sonographic abnormality at the 10
o'clock position 6 cm from the nipple. This is at the site of the
patient's left breast palpable lump. An oval, circumscribed anechoic
mass is seen at the 12 o'clock position 3 cm from the nipple. It
measures 1.1 x 1.0 x 0.6 cm. There is no associated vascularity.
This correlates well with the mammographic finding and is consistent
with a benign simple cyst.
IMPRESSION: 1. No suspicious mammographic or sonographic findings corresponding
with the patient's left breast palpable lump. Recommendation is for
clinical and symptomatic follow-up.
2. Benign left breast simple cyst.
3. No mammographic evidence of malignancy on the right.

RECOMMENDATION:
1. Clinical follow-up recommended for the palpable area of concern
in the left breast. Any further workup should be based on clinical
grounds.
2. Screening mammogram at age 41 unless there are persistent or
intervening clinical concerns. (Code:LT-H-MT4)

I have discussed the findings and recommendations with the patient.
If applicable, a reminder letter will be sent to the patient
regarding the next appointment.

BI-RADS CATEGORY  2: Benign.

## 2021-06-27 ENCOUNTER — Encounter: Payer: Self-pay | Admitting: Hematology

## 2021-07-04 ENCOUNTER — Ambulatory Visit: Payer: Medicaid Other | Admitting: Hematology

## 2021-07-04 ENCOUNTER — Other Ambulatory Visit: Payer: Medicaid Other

## 2021-07-06 ENCOUNTER — Telehealth: Payer: Self-pay | Admitting: Hematology

## 2021-07-06 NOTE — Telephone Encounter (Signed)
Left message with rescheduled upcoming appointment due to provider's emergency. 

## 2021-07-07 ENCOUNTER — Ambulatory Visit: Payer: Medicaid Other | Admitting: Hematology

## 2021-07-07 ENCOUNTER — Other Ambulatory Visit: Payer: Medicaid Other

## 2021-08-07 ENCOUNTER — Inpatient Hospital Stay: Payer: Medicaid Other | Admitting: Hematology

## 2021-08-07 ENCOUNTER — Inpatient Hospital Stay: Payer: Medicaid Other

## 2021-09-12 ENCOUNTER — Inpatient Hospital Stay: Payer: Medicaid Other | Attending: Hematology

## 2021-09-12 ENCOUNTER — Inpatient Hospital Stay: Payer: Medicaid Other | Admitting: Hematology

## 2021-09-12 ENCOUNTER — Other Ambulatory Visit: Payer: Self-pay

## 2021-09-12 VITALS — BP 116/74 | HR 78 | Temp 98.1°F | Resp 16 | Ht 63.0 in | Wt 130.1 lb

## 2021-09-12 DIAGNOSIS — D509 Iron deficiency anemia, unspecified: Secondary | ICD-10-CM | POA: Diagnosis present

## 2021-09-12 DIAGNOSIS — N920 Excessive and frequent menstruation with regular cycle: Secondary | ICD-10-CM | POA: Insufficient documentation

## 2021-09-12 LAB — CMP (CANCER CENTER ONLY)
ALT: 8 U/L (ref 0–44)
AST: 12 U/L — ABNORMAL LOW (ref 15–41)
Albumin: 4.2 g/dL (ref 3.5–5.0)
Alkaline Phosphatase: 56 U/L (ref 38–126)
Anion gap: 9 (ref 5–15)
BUN: 10 mg/dL (ref 6–20)
CO2: 25 mmol/L (ref 22–32)
Calcium: 9.7 mg/dL (ref 8.9–10.3)
Chloride: 106 mmol/L (ref 98–111)
Creatinine: 0.75 mg/dL (ref 0.44–1.00)
GFR, Estimated: >60 mL/min (ref >60)
Glucose, Bld: 112 mg/dL — ABNORMAL HIGH (ref 70–99)
Potassium: 3.4 mmol/L — ABNORMAL LOW (ref 3.5–5.1)
Sodium: 140 mmol/L (ref 135–145)
Total Bilirubin: 0.6 mg/dL (ref 0.3–1.2)
Total Protein: 7.5 g/dL (ref 6.5–8.1)

## 2021-09-12 LAB — CBC WITH DIFFERENTIAL/PLATELET
Abs Immature Granulocytes: 0.01 10*3/uL (ref 0.00–0.07)
Basophils Absolute: 0 10*3/uL (ref 0.0–0.1)
Basophils Relative: 0 %
Eosinophils Absolute: 0 10*3/uL (ref 0.0–0.5)
Eosinophils Relative: 1 %
HCT: 40.8 % (ref 36.0–46.0)
Hemoglobin: 13.5 g/dL (ref 12.0–15.0)
Immature Granulocytes: 0 %
Lymphocytes Relative: 35 %
Lymphs Abs: 1.7 10*3/uL (ref 0.7–4.0)
MCH: 26.1 pg (ref 26.0–34.0)
MCHC: 33.1 g/dL (ref 30.0–36.0)
MCV: 78.9 fL — ABNORMAL LOW (ref 80.0–100.0)
Monocytes Absolute: 0.2 10*3/uL (ref 0.1–1.0)
Monocytes Relative: 5 %
Neutro Abs: 2.9 10*3/uL (ref 1.7–7.7)
Neutrophils Relative %: 59 %
Platelets: 222 10*3/uL (ref 150–400)
RBC: 5.17 MIL/uL — ABNORMAL HIGH (ref 3.87–5.11)
RDW: 13.5 % (ref 11.5–15.5)
WBC: 5 10*3/uL (ref 4.0–10.5)
nRBC: 0 % (ref 0.0–0.2)

## 2021-09-12 LAB — FERRITIN: Ferritin: 24 ng/mL (ref 11–307)

## 2021-09-12 LAB — IRON AND TIBC
Iron: 81 ug/dL (ref 41–142)
Saturation Ratios: 26 % (ref 21–57)
TIBC: 308 ug/dL (ref 236–444)
UIBC: 226 ug/dL (ref 120–384)

## 2021-09-13 ENCOUNTER — Telehealth: Payer: Self-pay

## 2021-09-13 NOTE — Telephone Encounter (Signed)
Pt called c/o prolonged menstrual cycle. States she started her period on 10/8 and is still currently having some light spotting. She is not currently on anything for [facility]. States the period was light to moderate in flow and changed her pad/tampon once to twice daily depending on the flow. This is the first time this has ever happened. She denies any significant pain or cramping and no clotting. Advised patient it could be a number of different things and to monitor with her next cycle. Pt is scheduled for an appointment on Monday and advised to keep for now in case she is still having bleeding. If bleeding has stopped by Monday, pt will call and cancel and monitor cycle next month. If the bleeding is still going she will keep appointment for evaluation. Pt agreeable with plan and verbalized understanding.

## 2021-09-18 ENCOUNTER — Ambulatory Visit: Payer: Medicaid Other | Admitting: Advanced Practice Midwife

## 2021-09-18 NOTE — Progress Notes (Deleted)
   GYNECOLOGY PROGRESS NOTE  History:  40 y.o. [id number] presents to [facility] *** office today for problem gyn visit. She reports *****.  She denies h/a, dizziness, shortness of breath, n/v, or fever/chills.    The following portions of the patient's history were reviewed and updated as appropriate: allergies, current medications, past family history, past medical history, past social history, past surgical history and problem list. Last pap smear on *** was normal, *** HRHPV.  Health Maintenance Due  Topic Date Due   COVID-19 Vaccine (3 - Booster for Pfizer series) 05/04/2020   INFLUENZA VACCINE  Never done     Review of Systems:  Pertinent items are noted in HPI.   Objective:  Physical Exam There were no vitals taken for this visit. VS reviewed, nursing note reviewed,  Constitutional: well developed, well nourished, no distress HEENT: normocephalic CV: normal rate Pulm/chest wall: normal effort Breast Exam: deferred Abdomen: soft Neuro: alert and oriented x 3 Skin: warm, dry Psych: affect normal Pelvic exam: Cervix pink, visually closed, without lesion, scant white creamy discharge, vaginal walls and external genitalia normal Bimanual exam: Cervix 0/long/high, firm, anterior, neg CMT, uterus nontender, nonenlarged, adnexa without tenderness, enlargement, or mass  Assessment & Plan:  1. Abnormal uterine bleeding (AUB) ***   Sharen Counter, CNM 8:21 AM

## 2021-09-19 ENCOUNTER — Encounter: Payer: Self-pay | Admitting: Hematology

## 2021-09-19 NOTE — Progress Notes (Signed)
HEMATOLOGY/ONCOLOGY CLINIC NOTE  Date of Service: 04/16/18  Patient Care Team: [name], MD as PCP - General (Family Medicine)  CHIEF COMPLAINTS/PURPOSE OF CONSULTATION:  Iron Deficiency Anemia  HISTORY OF PRESENTING ILLNESS:   Katrina Wiley is a wonderful 40 y.o. female who has been referred to [place] by Dr [name] for evaluation and management of Iron Deficiency Anemia. The pt reports that she is doing well overall.   The pt reports that she has had four pregnancies and has three children, 63, 22 and 77 years old. She notes that she breast fed for 3 months each time. She notes that she had anemia in her pregnancies.  She notes that she had an endometrial ablation one year ago for her heavy periods which were 6 days in total and were heavy for 3 days, and occurred every 21 days. She denies blood transfusions or receiving IV iron in the past. She denies having fibroids at any time. She took PO iron pills, Ferralet, for a couple months and is not sure if this helped her lab work. She had some constipation with PO iron pills and is hesitant to restarting PO Iron. She denies being told of her anemia pre-puberty.   She notes that her periods are still frequent and are heavy for one day even after her endometrial ablation. She is taking oral contraceptives for cycle regulation.   She notes being very tired often. She notes that she has cravings for all purpose flour.   Her mom had a blood transfusion two years ago for her anemia, and her maternal family has sickle cell trait.    Most recent lab results (12/20/17) of CBC  is as follows: all values are WNL except for HGB at 10.5, HCT at 33.9, MCV at 68.2, MCH at 21.0, MCHC at 30.8, RDW at 18.5.  On review of systems, pt reports fatigue, pica, heavy periods, and denies nose bleeds, gum bleeds, other concerns for blood loss, leg swelling and any other symptoms.   On PMHx the pt reports iron deficiency anemia, sickle cell trait, scoliosis.  Endometrial ablation. She denies drug allergies, seasonal allergies, food allergies, bee stings.  On Social Hx the pt reports social ETOH use, and denies smoking. 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 On Family Hx the pt reports maternal sickle cell trait.   INTERVAL HISTORY  Katrina Wiley is a wonderful 40 y.o. female who is here today for evaluation and management of Iron Deficiency Anemia. The patient's last visit with [place] was on 01/03/2021. The pt reports that she is doing well overall.  The pt reports no new symptoms or concerns.  She is tolerating her oral iron replacement without any issues.  Labs done today show CBC with a hemoglobin of 13.5, WBC count of 5k, platelets 222k Ferritin 24 and iron saturation of 26%  On review of systems, pt denies fatigue, SOB,  abdominal pain, GI bleeding.  MEDICAL HISTORY:  Past Medical History:  Diagnosis Date   Abnormal vaginal Pap smear    cryo done in Dr Verdell Carmine office   Allergic rhinitis due to allergen    History of migraine headaches    Usually without aura   Hyperlipidemia    no meds, diet controlled   Iron deficiency anemia, unspecified    Iron deficiency; has required IV iron because she did not tolerate oral iron   Preterm labor    Sickle cell trait (HCC)    SVD (spontaneous vaginal  delivery)    x 3    SURGICAL HISTORY: Past Surgical History:  Procedure Laterality Date   DILATION AND CURETTAGE OF UTERUS     DILITATION & CURRETTAGE/HYSTROSCOPY WITH HYDROTHERMAL ABLATION N/A 06/13/2017   Procedure: DILATATION & CURETTAGE/HYSTEROSCOPY WITH ATTEMPTED HYDROTHERMAL ABLATION;  Surgeon: Allie Bossier, MD;  Location: [facility];  Service: Gynecology;  Laterality: N/A;   INCISION AND DRAINAGE PERIRECTAL ABSCESS  2008   TUBAL LIGATION     WISDOM TOOTH EXTRACTION      SOCIAL HISTORY: Social History   Socioeconomic History   Marital status: Single    Spouse name: Not on file   Number of children: 3   Years of  education: Not on file   Highest education level: Associate degree: occupational, Scientist, product/process development, or vocational program  Occupational History   Occupation: Programmer, multimedia: [employer]  Tobacco Use   Smoking status: Never   Smokeless tobacco: Never  Vaping Use   Vaping Use: Never used  Substance and Sexual Activity   Alcohol use: Yes    Alcohol/week: 0.0 standard drinks    Comment: occasional   Drug use: No   Sexual activity: Yes    Partners: Male    Birth control/protection: Surgical  Other Topics Concern   Not on file  Social History Narrative   Single mother of 3 daughters: Chanetta Marshall (73), Tanasiah (63), [place] (8)   Never smoked.  Rare wine".   No recreational drugs.   Diet: Eats all types of foods except beef and pork.   Exercise: No set routine, but intermittent walking.   Religion: [facility], enjoys traveling.   Social Determinants of Health   Financial Resource Strain: Not on file  Food Insecurity: Not on file  Transportation Needs: Not on file  Physical Activity: Not on file  Stress: Not on file  Social Connections: Not on file  Intimate Partner Violence: Not on file    FAMILY HISTORY: Family History  Problem Relation Age of Onset   Anemia Mother    Hypertension Father    Hypertension Maternal Grandmother    Hyperlipidemia Maternal Grandmother    Stroke Maternal Grandmother    Hypertension Maternal Grandfather    Hyperlipidemia Maternal Grandfather    Diabetes Paternal Grandmother    Cirrhosis Paternal Grandfather    Breast cancer Other     ALLERGIES:  has No Known Allergies.  MEDICATIONS:  Current Outpatient Medications  Medication Sig Dispense Refill   docusate sodium (COLACE) 100 MG capsule Take 1 capsule (100 mg total) by mouth 2 (two) times daily. 60 capsule 11   ibuprofen (ADVIL) 800 MG tablet TAKE 1 TABLET BY MOUTH EVERY 8 HOURS AS NEEDED 30 tablet 5   SUMAtriptan (IMITREX) 50 MG tablet Take by  mouth.     Drospirenone (SLYND) 4 MG TABS Take 1 tablet by mouth daily. 28 tablet 11   fluticasone (FLONASE) 50 MCG/ACT nasal spray Place into both nostrils.     folic acid (FOLVITE) 1 MG tablet Take 2 tablets (2 mg total) by mouth daily. 60 tablet 11   metroNIDAZOLE (FLAGYL) 500 MG tablet Take 1 tablet (500 mg total) by mouth 2 (two) times daily. 14 tablet 2   Prenat w/o A-FeCbGl-DSS-FA-DHA (CITRANATAL 90 DHA) 90-1 & 300 MG MISC Take 1 tablet by mouth daily before breakfast. (Patient not taking: No sig reported) 60 each 11   spironolactone (ALDACTONE) 25 MG tablet Take 25 mg by mouth 2 (  two) times daily.     No current facility-administered medications for this visit.    REVIEW OF SYSTEMS:   10 Point review of Systems was done is negative except as noted above.  PHYSICAL EXAMINATION: Vitals:   09/12/21 1348  BP: 116/74  Pulse: 78  Resp: 16  Temp: 98.1 F (36.7 C)  SpO2: 100%   Filed Weights   09/12/21 1348  Weight: 130 lb 1.6 oz (59 kg)   .Body mass index is 23.05 kg/m.  Marland Kitchen GENERAL:alert, in no acute distress and comfortable SKIN: no acute rashes, no significant lesions EYES: conjunctiva are pink and non-injected, sclera anicteric OROPHARYNX: MMM, no exudates, no oropharyngeal erythema or ulceration NECK: supple, no JVD LYMPH:  no palpable lymphadenopathy in the cervical, axillary or inguinal regions LUNGS: clear to auscultation b/l with normal respiratory effort HEART: regular rate & rhythm ABDOMEN:  normoactive bowel sounds , non tender, not distended. Extremity: no pedal edema PSYCH: alert & oriented x 3 with fluent speech NEURO: no focal motor/sensory deficits   LABORATORY DATA:  I have reviewed the data as listed  CBC Latest Ref Rng & Units 09/12/2021 01/03/2021 11/13/2018  WBC 4.0 - 10.5 K/uL 5.0 4.2 5.6  Hemoglobin 12.0 - 15.0 g/dL 56.4 33.2 95.1  Hematocrit 36.0 - 46.0 % 40.8 41.6 37.1  Platelets 150 - 400 K/uL 222 214 194   . CBC    Component Value  Date/Time   WBC 5.0 09/12/2021 1332   RBC 5.17 (H) 09/12/2021 1332   HGB 13.5 09/12/2021 1332   HGB 13.6 01/03/2021 0849   HCT 40.8 09/12/2021 1332   PLT 222 09/12/2021 1332   PLT 214 01/03/2021 0849   MCV 78.9 (L) 09/12/2021 1332   MCH 26.1 09/12/2021 1332   MCHC 33.1 09/12/2021 1332   RDW 13.5 09/12/2021 1332   LYMPHSABS 1.7 09/12/2021 1332   MONOABS 0.2 09/12/2021 1332   EOSABS 0.0 09/12/2021 1332   BASOSABS 0.0 09/12/2021 1332    . CMP Latest Ref Rng & Units 09/12/2021 01/03/2021 04/16/2018  Glucose 70 - 99 mg/dL 884(Z) 82 75  BUN 6 - 20 mg/dL 10 10 11   Creatinine 0.44 - 1.00 mg/dL 6.60 6.30  Sodium 135 - 145 mmol/L 140 139 139  Potassium 3.5 - 5.1 mmol/L 3.4(L) 3.9 3.5  Chloride 98 - 111 mmol/L 106 108 105  CO2 22 - 32 mmol/L 25 24 27   Calcium 8.9 - 10.3 mg/dL 9.7 9.4 9.7  Total Protein 6.5 - 8.1 g/dL 7.5 7.9 1.60)  Total Bilirubin 0.3 - 1.2 mg/dL 0.6 0.6 0.5  Alkaline Phos 38 - 126 U/L 56 66 50  AST 15 - 41 U/L 12(L) 17 17  ALT 0 - 44 U/L 8 20 13    . Lab Results  Component Value Date   IRON 81 09/12/2021   TIBC 308 09/12/2021   IRONPCTSAT 26 09/12/2021   (Iron and TIBC)  Lab Results  Component Value Date   FERRITIN 24 09/12/2021    . Lab Results  Component Value Date   IRON 81 09/12/2021   TIBC 308 09/12/2021   IRONPCTSAT 26 09/12/2021   (Iron and TIBC)  Lab Results  Component Value Date   FERRITIN 24 09/12/2021      12/24/17 CBC:    RADIOGRAPHIC STUDIES: I have personally reviewed the radiological images as listed and agreed with the findings in the report. No results found.  ASSESSMENT & PLAN:  40 y.o. female with  1.Microcytic Anemia due to severe  iron deficiency 2. Severe irion deficiency due to heavy menstrual losses. Not responsive to PO iron replacement 3. Pica symptoms related to Iron deficiency anemia - resolved 4. Sickle Cell trait  PLAN -Discussed patient's most recent labs from today, 09/12/2021.  Hemoglobin normal  at 13.5, ferritin of 24 with an iron saturation of 26% -Recommended pt f/u w OBGYN regarding evaluation and mx of her heavy periods -Continue p.o. iron iron polysaccharide 150 mg p.o. daily -Recheck labs with primary care physician in 2 to 3 months. -Please refer patient back if anemic or iron labs continue to worsen despite oral iron replacement and addressing her GYN issues.  FOLLOW UP: RTC with Dr Candise Che as needed RTC with PCP  All of the patients questions were answered with apparent satisfaction. The patient knows to call the clinic with any problems, questions or concerns.  The total time spent in the appointment was 20 minutes and more than 50% was on counseling and direct patient cares.  Wyvonnia Lora MD MS AAHIVMS [facility] Hematology/Oncology Physician [facility]

## 2021-10-03 ENCOUNTER — Ambulatory Visit: Payer: Medicaid Other | Admitting: Obstetrics and Gynecology

## 2021-10-03 ENCOUNTER — Encounter: Payer: Self-pay | Admitting: Obstetrics and Gynecology

## 2021-10-03 ENCOUNTER — Other Ambulatory Visit: Payer: Self-pay

## 2021-10-03 VITALS — BP 128/78 | HR 89 | Ht 63.0 in | Wt 128.0 lb

## 2021-10-03 DIAGNOSIS — N939 Abnormal uterine and vaginal bleeding, unspecified: Secondary | ICD-10-CM

## 2021-10-03 MED ORDER — SLYND 4 MG PO TABS: 1.0000 | ORAL_TABLET | Freq: Every day | ORAL | 12 refills | Status: DC

## 2021-10-03 NOTE — Progress Notes (Signed)
GYN presents for vaginal bleeding since 10/8-11/5/22, cramping 5/10. Bleeding stopped 3 days ago.  She is not using any BC.

## 2021-10-03 NOTE — Progress Notes (Signed)
40 yo P2 presenting today for the evaluation of AUB. Patient reports a month long history of vaginal bleeding in October. She describes the bleeding as light enough to need a panty liner for 2-3 weeks followed by 3 days of heavy flow. Patient denies pelvic pain or abnormal discharge. She reports a normal vaginal swab with her PCP last week. She is without any complaints currently. She has not taken the prescribed POP rx from Dr. Clearance Coots. Patient states a similar bleeding pattern occurred in 2019  Past Medical History:  Diagnosis Date   Abnormal vaginal Pap smear    cryo done in Dr Verdell Carmine office   Allergic rhinitis due to allergen    History of migraine headaches    Usually without aura   Hyperlipidemia    no meds, diet controlled   Iron deficiency anemia, unspecified    Iron deficiency; has required IV iron because she did not tolerate oral iron   Preterm labor    Sickle cell trait (HCC)    SVD (spontaneous vaginal delivery)    x 3   Past Surgical History:  Procedure Laterality Date   DILATION AND CURETTAGE OF UTERUS     DILITATION & CURRETTAGE/HYSTROSCOPY WITH HYDROTHERMAL ABLATION N/A 06/13/2017   Procedure: DILATATION & CURETTAGE/HYSTEROSCOPY WITH ATTEMPTED HYDROTHERMAL ABLATION;  Surgeon: Allie Bossier, MD;  Location: [facility];  Service: Gynecology;  Laterality: N/A;   INCISION AND DRAINAGE PERIRECTAL ABSCESS  2008   TUBAL LIGATION     WISDOM TOOTH EXTRACTION     Family History  Problem Relation Age of Onset   Anemia Mother    Hypertension Father    Hypertension Maternal Grandmother    Hyperlipidemia Maternal Grandmother    Stroke Maternal Grandmother    Hypertension Maternal Grandfather    Hyperlipidemia Maternal Grandfather    Diabetes Paternal Grandmother    Cirrhosis Paternal Grandfather    Breast cancer Other    Social History   Tobacco Use   Smoking status: Never   Smokeless tobacco: Never  Vaping Use   Vaping Use: Never used  Substance Use Topics   Alcohol use:  Yes    Alcohol/week: 0.0 standard drinks    Comment: occasional   Drug use: No   ROS See pertinent in HPI. All other systems reviewed and non contributory Blood pressure 128/78, pulse 89, height 5\' 3"  (1.6 m), weight 128 lb (58.1 kg), last menstrual period 09/02/2021.  GENERAL: Well-developed, well-nourished female in no acute distress.  NEURO: alert and oriented x 3  A/P 40 yo with  - Patient with normal pap smear in 01/2021 - Patient had breast cancer screening this year - Advised to keep a menstrual calendar - Pelvic ultrasound ordered - Patient will be contacted with results

## 2021-10-09 ENCOUNTER — Ambulatory Visit (HOSPITAL_BASED_OUTPATIENT_CLINIC_OR_DEPARTMENT_OTHER)
Admission: RE | Admit: 2021-10-09 | Discharge: 2021-10-09 | Disposition: A | Discharge status: Home / Self Care | Payer: Medicaid Other | Source: Ambulatory Visit | Attending: Obstetrics and Gynecology | Admitting: Obstetrics and Gynecology

## 2021-10-09 ENCOUNTER — Other Ambulatory Visit: Payer: Self-pay

## 2021-10-09 DIAGNOSIS — N939 Abnormal uterine and vaginal bleeding, unspecified: Secondary | ICD-10-CM | POA: Diagnosis not present

## 2021-10-10 ENCOUNTER — Telehealth: Payer: Self-pay

## 2021-10-10 NOTE — Telephone Encounter (Signed)
Pt called stating that she read her ultrasound report through her MyChart and saw that she has an ovarian cyst and fibroid. She is wanting to know what she needs to do now. Please advise.

## 2021-11-19 ENCOUNTER — Encounter (HOSPITAL_BASED_OUTPATIENT_CLINIC_OR_DEPARTMENT_OTHER): Payer: Self-pay

## 2021-11-19 ENCOUNTER — Emergency Department (HOSPITAL_BASED_OUTPATIENT_CLINIC_OR_DEPARTMENT_OTHER)
Admission: EM | Admit: 2021-11-19 | Discharge: 2021-11-19 | Disposition: A | Discharge status: Home / Self Care | Payer: Medicaid Other | Attending: Emergency Medicine | Admitting: Emergency Medicine

## 2021-11-19 ENCOUNTER — Other Ambulatory Visit: Payer: Self-pay

## 2021-11-19 DIAGNOSIS — R000 Tachycardia, unspecified: Secondary | ICD-10-CM | POA: Insufficient documentation

## 2021-11-19 DIAGNOSIS — U071 COVID-19: Secondary | ICD-10-CM | POA: Diagnosis not present

## 2021-11-19 DIAGNOSIS — R059 Cough, unspecified: Secondary | ICD-10-CM | POA: Diagnosis present

## 2021-11-19 LAB — RESP PANEL BY RT-PCR (FLU A&B, COVID) ARPGX2
Influenza A by PCR: NEGATIVE
Influenza B by PCR: NEGATIVE
SARS Coronavirus 2 by RT PCR: POSITIVE — AB

## 2021-11-19 MED ORDER — BENZONATATE 100 MG PO CAPS: 100.0000 mg | ORAL_CAPSULE | Freq: Three times a day (TID) | ORAL | 0 refills | Status: DC

## 2021-11-19 MED ORDER — ACETAMINOPHEN 325 MG PO TABS
650.0000 mg | ORAL_TABLET | Freq: Once | ORAL | Status: AC
  Administered 2021-11-19: 13:00:00 650 mg via ORAL
  Filled 2021-11-19: qty 2

## 2021-11-19 NOTE — ED Provider Notes (Signed)
[facility] EMERGENCY DEPARTMENT Provider Note   CSN: [id number] Arrival date & time: 11/19/21  1229     History Chief Complaint  Patient presents with   Cough    Katrina Wiley is a 40 y.o. female.  40 year old female presents today for evaluation of muscle aches, fever, cough, congestion.  Patient has not taken anything over-the-counter prior to arrival.  She has been hydrating well.  She denies nausea, vomiting, abdominal pain, or shortness of breath.  The history is provided by the patient. No language interpreter was used.      Past Medical History:  Diagnosis Date   Abnormal vaginal Pap smear    cryo done in Dr Verdell Carmine office   Allergic rhinitis due to allergen    History of migraine headaches    Usually without aura   Hyperlipidemia    no meds, diet controlled   Iron deficiency anemia, unspecified    Iron deficiency; has required IV iron because she did not tolerate oral iron   Preterm labor    Sickle cell trait (HCC)    SVD (spontaneous vaginal delivery)    x 3    Patient Active Problem List   Diagnosis Date Noted   Nonspecific abnormal electrocardiogram (ECG) (EKG) 09/20/2020   Chest wall pain 09/20/2020   Palpitations 09/20/2020   Iron deficiency anemia 04/16/2018   Microcytic anemia 04/16/2018    Past Surgical History:  Procedure Laterality Date   DILATION AND CURETTAGE OF UTERUS     DILITATION & CURRETTAGE/HYSTROSCOPY WITH HYDROTHERMAL ABLATION N/A 06/13/2017   Procedure: DILATATION & CURETTAGE/HYSTEROSCOPY WITH ATTEMPTED HYDROTHERMAL ABLATION;  Surgeon: Allie Bossier, MD;  Location: [facility];  Service: Gynecology;  Laterality: N/A;   INCISION AND DRAINAGE PERIRECTAL ABSCESS  2008   TUBAL LIGATION     WISDOM TOOTH EXTRACTION       OB History     Gravida  4   Para  3   Term  2   Preterm  1   AB  1   Living  2      SAB      IAB  1   Ectopic      Multiple      Live Births              Family History  Problem  Relation Age of Onset   Anemia Mother    Hypertension Father    Hypertension Maternal Grandmother    Hyperlipidemia Maternal Grandmother    Stroke Maternal Grandmother    Hypertension Maternal Grandfather    Hyperlipidemia Maternal Grandfather    Diabetes Paternal Grandmother    Cirrhosis Paternal Grandfather    Breast cancer Other     Social History   Tobacco Use   Smoking status: Never   Smokeless tobacco: Never  Vaping Use   Vaping Use: Never used  Substance Use Topics   Alcohol use: Yes    Alcohol/week: 0.0 standard drinks    Comment: occasional   Drug use: No    Home Medications Prior to Admission medications   Medication Sig Start Date End Date Taking? Authorizing Provider  docusate sodium (COLACE) 100 MG capsule Take 1 capsule (100 mg total) by mouth 2 (two) times daily. 01/24/21   Brock Bad, MD  Drospirenone (SLYND) 4 MG TABS Take 1 tablet by mouth daily. 01/24/21   Brock Bad, MD  Drospirenone (SLYND) 4 MG TABS Take 1 tablet by mouth daily. 10/03/21   Constant, Gigi Gin, MD  fluticasone (FLONASE) 50 MCG/ACT nasal spray Place into both nostrils. 07/26/20   [provider]  folic acid (FOLVITE) 1 MG tablet Take 2 tablets (2 mg total) by mouth daily. 12/04/19   Brock Bad, MD  ibuprofen (ADVIL) 800 MG tablet TAKE 1 TABLET BY MOUTH EVERY 8 HOURS AS NEEDED 12/21/20   Brock Bad, MD  metroNIDAZOLE (FLAGYL) 500 MG tablet Take 1 tablet (500 mg total) by mouth 2 (two) times daily. 12/29/20   Brock Bad, MD  Prenat w/o A-FeCbGl-DSS-FA-DHA (CITRANATAL 90 DHA) 90-1 & 300 MG MISC Take 1 tablet by mouth daily before breakfast. Patient not taking: No sig reported 01/24/21   Brock Bad, MD  spironolactone (ALDACTONE) 25 MG tablet Take 25 mg by mouth 2 (two) times daily. 08/31/20   [provider]  SUMAtriptan (IMITREX) 50 MG tablet Take by mouth. 10/11/20   [provider]    Allergies    Patient has no known  allergies.  Review of Systems   Review of Systems  Constitutional:  Negative for activity change, chills and fever.  HENT:  Positive for sore throat. Negative for trouble swallowing.   Respiratory:  Positive for cough. Negative for shortness of breath.   Gastrointestinal:  Negative for abdominal pain, nausea and vomiting.  Musculoskeletal:  Positive for myalgias.  Neurological:  Negative for weakness.  All other systems reviewed and are negative.  Physical Exam Updated Vital Signs BP 122/86 (BP Location: Right Arm)    Pulse (!) 103    Temp (!) 100.4 F (38 C) (Oral)    Resp 16    Ht 5\' 3"  (1.6 m)    Wt 59 kg    LMP 11/05/2021    SpO2 99%    BMI 23.03 kg/m   Physical Exam Vitals and nursing note reviewed.  Constitutional:      General: She is not in acute distress.    Appearance: Normal appearance. She is not ill-appearing.  HENT:     Head: Normocephalic and atraumatic.     Nose: Nose normal.  Eyes:     Conjunctiva/sclera: Conjunctivae normal.  Cardiovascular:     Rate and Rhythm: Regular rhythm. Tachycardia present.  Pulmonary:     Effort: Pulmonary effort is normal. No respiratory distress.     Breath sounds: Normal breath sounds. No wheezing.  Abdominal:     General: There is no distension.     Tenderness: There is no abdominal tenderness. There is no guarding.  Musculoskeletal:        General: No deformity.     Cervical back: Normal range of motion.  Skin:    Findings: No rash.  Neurological:     Mental Status: She is alert.    ED Results / Procedures / Treatments   Labs (all labs ordered are listed, but only abnormal results are displayed) Labs Reviewed  RESP PANEL BY RT-PCR (FLU A&B, COVID) ARPGX2 - Abnormal; Notable for the following components:      Result Value   SARS Coronavirus 2 by RT PCR POSITIVE (*)    All other components within normal limits    EKG None  Radiology No results found.  Procedures Procedures   Medications Ordered in  ED Medications  acetaminophen (TYLENOL) tablet 650 mg (650 mg Oral Given 11/19/21 1328)    ED Course  I have reviewed the triage vital signs and the nursing notes.  Pertinent labs & imaging results that were available during my care of the patient  were reviewed by me and considered in my medical decision making (see chart for details).    MDM Rules/Calculators/A&P                         40 year old female presents today with URI symptoms.  Respiratory panel positive for COVID.  Patient is well-appearing.  Symptomatic treatment discussed.  Return precautions discussed.  Patient voices understanding and is in agreement with plan.   Vitals:   11/19/21 1238 11/19/21 1415  BP: 122/86   Pulse: (!) 103   Resp: 16   Temp: (!) 100.4 F (38 C) 98.2 F (36.8 C)  SpO2: 99%        Final Clinical Impression(s) / ED Diagnoses Final diagnoses:  COVID-19    Rx / DC Orders ED Discharge Orders          Ordered    benzonatate (TESSALON) 100 MG capsule  Every 8 hours        11/19/21 1421             Marita Kansas, PA-C 11/19/21 1423    Virgina Norfolk, DO 11/19/21 1452

## 2021-11-19 NOTE — ED Triage Notes (Signed)
Pt c/o flu like sx started last night-NAD-steady gait 

## 2021-11-19 NOTE — Discharge Instructions (Addendum)
You are COVID-positive.  Take Tylenol Motrin for fever control.  Continue to drink plenty of fluids.  If you develop nausea vomiting and or vomiting to the point you are unable to keep any food or drink down please return to the emergency room.  Otherwise you follow-up with primary care provider.

## 2021-12-09 IMAGING — US US PELVIS COMPLETE WITH TRANSVAGINAL
1 series · 13 of 25 positions shown · non-contrast
Comparison: 01/23/2019

CLINICAL DATA: Abnormal uterine bleeding for 1 month. History of
endometrial ablation. LMP 09/02/2021.



[Series 1: us pelvic complete with transvaginal · 79 acquisitions, 13 frames shown]
[im 1/79]
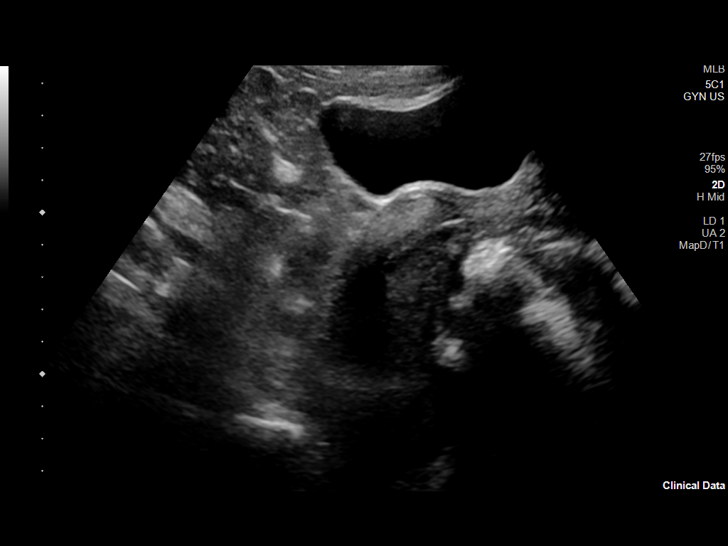
[im 7/79]
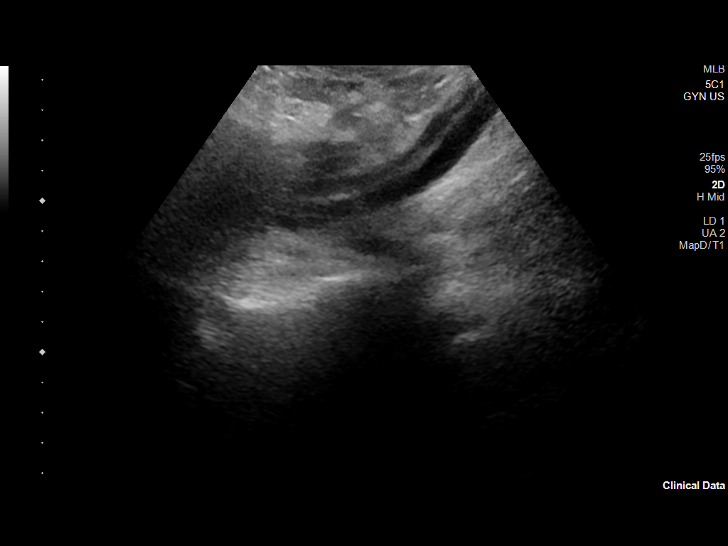
[im 14/79]
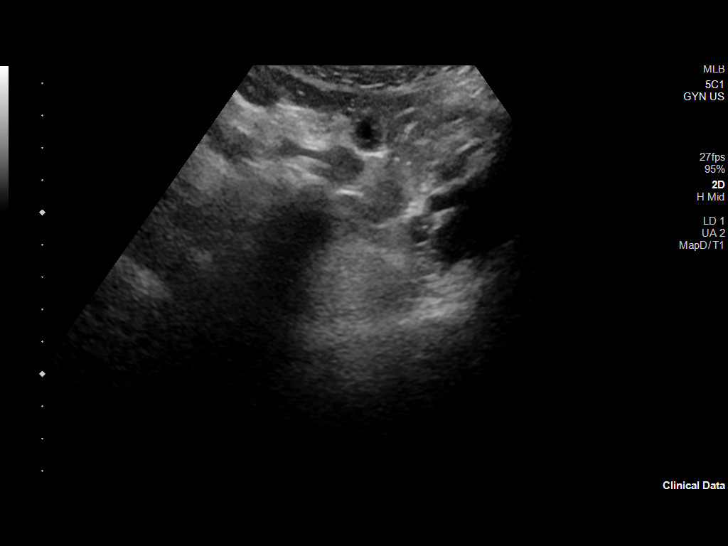
[im 20/79]
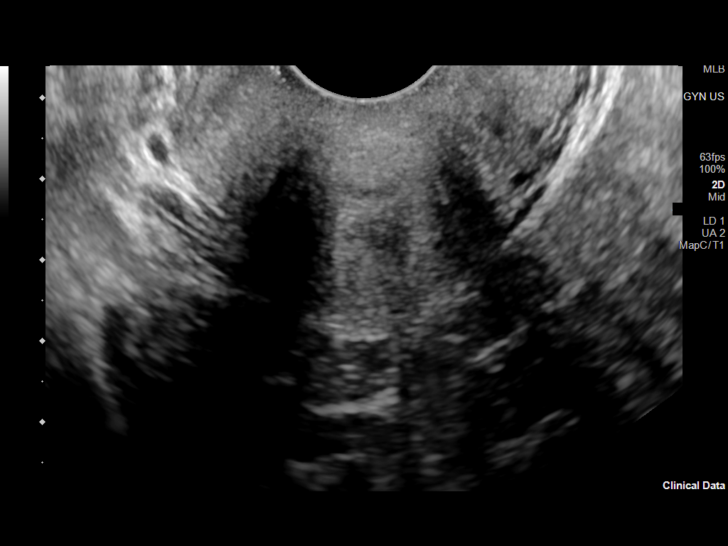
[im 27/79]
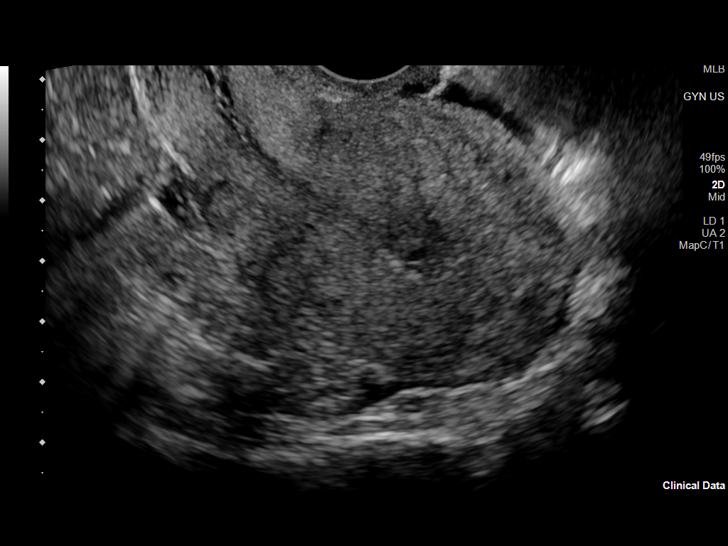
[im 33/79]
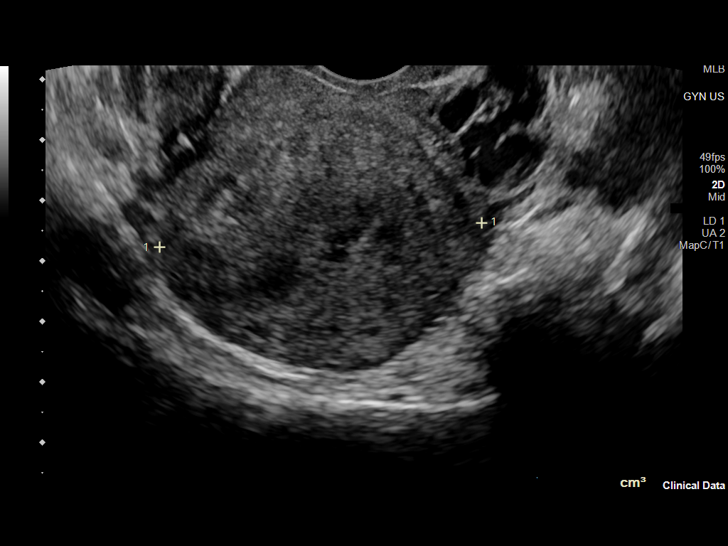
[im 40/79]
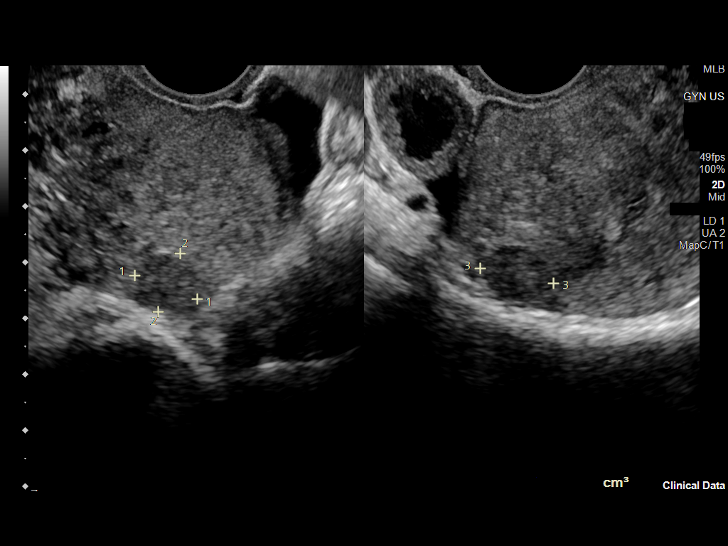
[im 46/79]
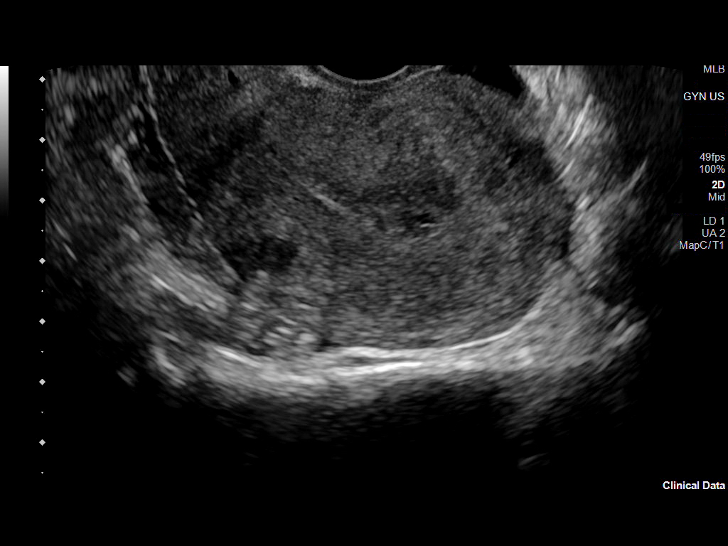
[im 53/79]
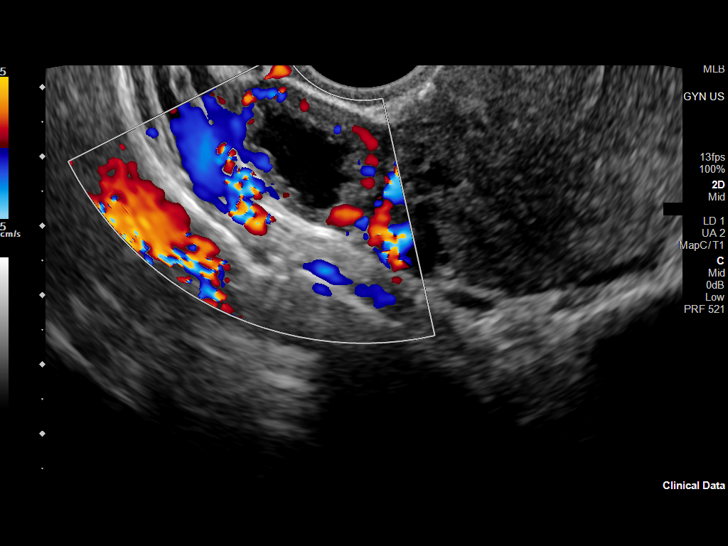
[im 59/79]
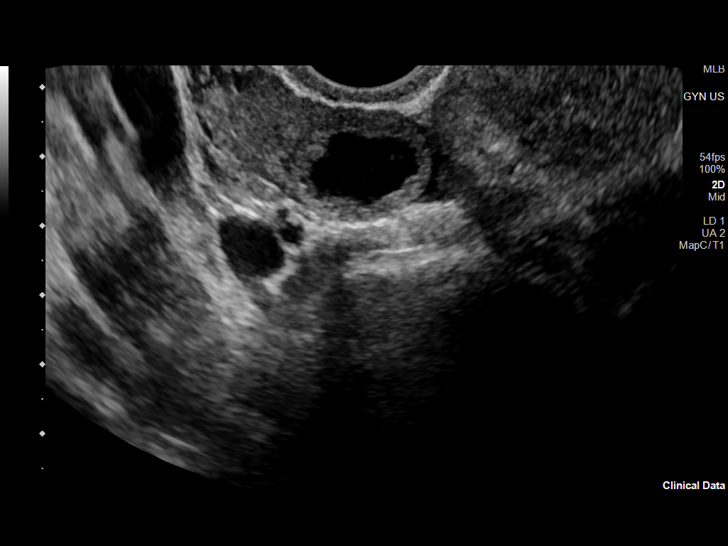
[im 66/79]
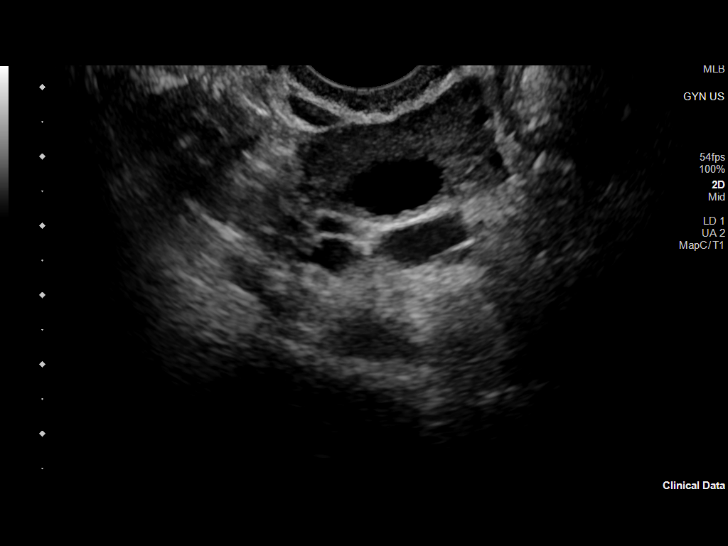
[im 72/79]
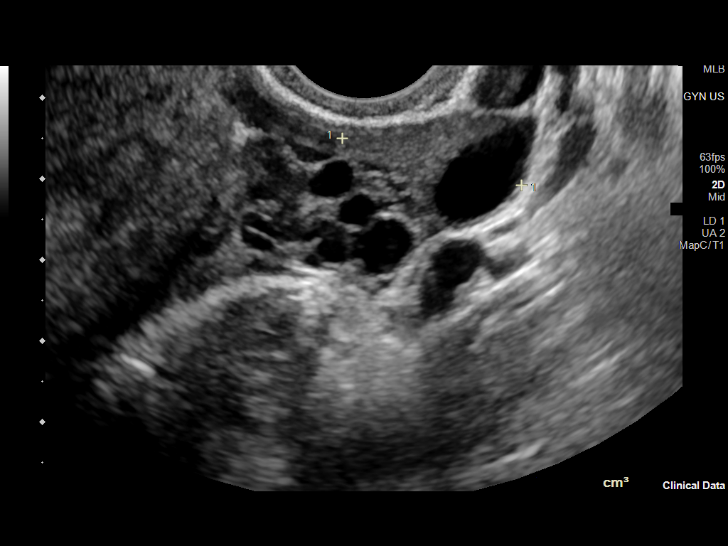
[im 79/79]
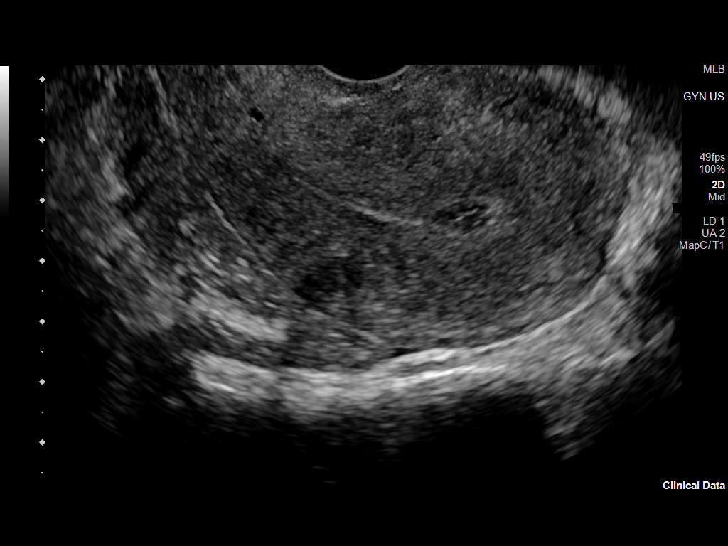

[13 of 25 positions shown; findings below may reference images not displayed]

FINDINGS: Uterus

Measurements: 8.9 x 4.7 x 5.3 centimeters = volume: 117.1 mL. Uterus
is retroverted. Small fibroids are identified. Anterior subserosal
fibroid is 0.8 x 0.6 x 1.1 centimeters. Second fibroid is fundal and
subserosal, 1.2 x 1.1 x 1.3 centimeters.

Endometrium

Thickness: 5.0 millimeters.  No focal abnormality visualized.

Right ovary

Measurements: 3.5 x 1.8 x 1.6 centimeters = volume: 5.5 mL. Small
corpus luteum cyst is 2.1 centimeters.

Left ovary

Measurements: 2.7 x 1.5 x 2.3 centimeters = volume: 4.8 mL. Small
follicles present.

Other findings

Trace free pelvic fluid is likely physiologic.
IMPRESSION: 1. Small subserosal fibroids, largest 1.2 centimeters.
2. Normal endometrial thickness. If bleeding remains unresponsive to
hormonal or medical therapy, sonohysterogram should be considered
for focal lesion work-up. (Ref: Radiological Reasoning: Algorithmic
Workup of Abnormal Vaginal Bleeding with Endovaginal Sonography and
Sonohysterography. AJR [id number]; 191:S68-73)
3. Normal appearance of both ovaries.

## 2021-12-22 ENCOUNTER — Encounter: Payer: Self-pay | Admitting: Hematology

## 2022-01-18 ENCOUNTER — Other Ambulatory Visit: Payer: Self-pay | Admitting: *Deleted

## 2022-01-18 DIAGNOSIS — N898 Other specified noninflammatory disorders of vagina: Secondary | ICD-10-CM

## 2022-01-18 MED ORDER — METRONIDAZOLE 0.75 % VA GEL: 1.0000 | Freq: Every evening | VAGINAL | 0 refills | Status: DC

## 2022-01-18 NOTE — Progress Notes (Signed)
TC from pt reporting symptoms of BV. Unable to tolerate previous RX Flagyl PO. RX Metrogel per protocol. Pt verbalized understanding. Reminded pt of annual due 01/2022.

## 2022-02-14 ENCOUNTER — Ambulatory Visit: Payer: Medicaid Other | Admitting: Obstetrics

## 2022-03-06 ENCOUNTER — Ambulatory Visit: Payer: Medicaid Other | Admitting: Obstetrics

## 2022-04-12 ENCOUNTER — Ambulatory Visit (INDEPENDENT_AMBULATORY_CARE_PROVIDER_SITE_OTHER): Payer: Medicaid Other | Admitting: Obstetrics and Gynecology

## 2022-04-12 ENCOUNTER — Other Ambulatory Visit (HOSPITAL_COMMUNITY)
Admission: RE | Admit: 2022-04-12 | Discharge: 2022-04-12 | Disposition: A | Discharge status: Home / Self Care | Payer: Medicaid Other | Source: Ambulatory Visit | Attending: Obstetrics | Admitting: Obstetrics

## 2022-04-12 ENCOUNTER — Encounter: Payer: Self-pay | Admitting: Obstetrics and Gynecology

## 2022-04-12 DIAGNOSIS — Z1231 Encounter for screening mammogram for malignant neoplasm of breast: Secondary | ICD-10-CM

## 2022-04-12 DIAGNOSIS — Z202 Contact with and (suspected) exposure to infections with a predominantly sexual mode of transmission: Secondary | ICD-10-CM | POA: Diagnosis not present

## 2022-04-12 DIAGNOSIS — Z01419 Encounter for gynecological examination (general) (routine) without abnormal findings: Secondary | ICD-10-CM | POA: Diagnosis not present

## 2022-04-12 DIAGNOSIS — Z9851 Tubal ligation status: Secondary | ICD-10-CM | POA: Insufficient documentation

## 2022-04-12 HISTORY — DX: Tubal ligation status: Z98.51

## 2022-04-12 NOTE — Progress Notes (Signed)
Pt is wondering if she needs any repeat u/s to re-eval fibroids.  Pt is not on any form of BC.

## 2022-04-12 NOTE — Progress Notes (Signed)
Katrina Wiley is a 41 y.o. [phone] female here for a routine annual gynecologic exam.  Current complaints: None.   Denies abnormal vaginal bleeding, discharge, pelvic pain, problems with intercourse or other gynecologic concerns.    GYN U/S 11/22 small uterine fibroids. Reviewed with pt  Gynecologic History Patient's last menstrual period was 03/25/2022. Contraception: tubal ligation Last Pap: 3/22. Results were: normal Last mammogram: NA.  Obstetric History OB History  Gravida Para Term Preterm AB Living  4 3 2 1 1 2   SAB IAB Ectopic Multiple Live Births    1          # Outcome Date GA Lbr Len/2nd Weight Sex Delivery Anes PTL Lv  4 Term      Vag-Spont        Birth Comments: System Generated. Please review and update pregnancy details.  3 IAB           2 Preterm      Vag-Spont     1 Term      Vag-Spont       Past Medical History:  Diagnosis Date   Abnormal vaginal Pap smear    cryo done in Dr office   Allergic rhinitis due to allergen    H/O tubal ligation 04/12/2022   History of migraine headaches    Usually without aura   Hyperlipidemia    no meds, diet controlled   Iron deficiency anemia, unspecified    Iron deficiency; has required IV iron because she did not tolerate oral iron   Preterm labor    Sickle cell trait (HCC)    SVD (spontaneous vaginal delivery)    x 3    Past Surgical History:  Procedure Laterality Date   DILATION AND CURETTAGE OF UTERUS     DILITATION & CURRETTAGE/HYSTROSCOPY WITH HYDROTHERMAL ABLATION N/A 06/13/2017   Procedure: DILATATION & CURETTAGE/HYSTEROSCOPY WITH ATTEMPTED HYDROTHERMAL ABLATION;  Surgeon: 06/15/2017, MD;  Location: [facility];  Service: Gynecology;  Laterality: N/A;   INCISION AND DRAINAGE PERIRECTAL ABSCESS  2008   TUBAL LIGATION     WISDOM TOOTH EXTRACTION      Current Outpatient Medications on File Prior to Visit  Medication Sig Dispense Refill   ibuprofen (ADVIL) 800 MG tablet TAKE 1 TABLET BY MOUTH EVERY  8 HOURS AS NEEDED 30 tablet 5   SUMAtriptan (IMITREX) 50 MG tablet Take by mouth.     No current facility-administered medications on file prior to visit.    No Known Allergies  Social History   Socioeconomic History   Marital status: Single    Spouse name: Not on file   Number of children: 3   Years of education: Not on file   Highest education level: Associate degree: occupational, 2009, or vocational program  Occupational History   Occupation: Scientist, product/process development: [employer]  Tobacco Use   Smoking status: Never   Smokeless tobacco: Never  Vaping Use   Vaping Use: Never used  Substance and Sexual Activity   Alcohol use: Yes    Alcohol/week: 0.0 standard drinks    Comment: occasional   Drug use: No   Sexual activity: Yes    Partners: Male    Birth control/protection: None  Other Topics Concern   Not on file  Social History Narrative   Single mother of 3 daughters: Programmer, multimedia (79), Tanasiah (43), 12 (8)   Never smoked.  Rare wine".   No recreational drugs.   Diet: Eats all types  of foods except beef and pork.   Exercise: No set routine, but intermittent walking.   Religion: [facility], enjoys traveling.   Social Determinants of Health   Financial Resource Strain: Not on file  Food Insecurity: Not on file  Transportation Needs: Not on file  Physical Activity: Not on file  Stress: Not on file  Social Connections: Not on file  Intimate Partner Violence: Not on file    Family History  Problem Relation Age of Onset   Anemia Mother    Hypertension Father    Hypertension Maternal Grandmother    Hyperlipidemia Maternal Grandmother    Stroke Maternal Grandmother    Hypertension Maternal Grandfather    Hyperlipidemia Maternal Grandfather    Diabetes Paternal Grandmother    Cirrhosis Paternal Grandfather    Breast cancer Other     The following portions of the patient's history were reviewed and updated as  appropriate: allergies, current medications, past family history, past medical history, past social history, past surgical history and problem list.  Review of Systems Pertinent items noted in HPI and remainder of comprehensive ROS otherwise negative.   Objective:  BP 122/75   Pulse 89   Ht 5\' 3"  (1.6 m)   Wt 134 lb (60.8 kg)   LMP 03/25/2022   BMI 23.74 kg/m  CONSTITUTIONAL: Well-developed, well-nourished female in no acute distress.  HENT:  Normocephalic, atraumatic, External right and left ear normal. Oropharynx is clear and moist EYES: Conjunctivae and EOM are normal. Pupils are equal, round, and reactive to light. No scleral icterus.  NECK: Normal range of motion, supple, no masses.  Normal thyroid.  SKIN: Skin is warm and dry. No rash noted. Not diaphoretic. No erythema. No pallor. NEUROLGIC: Alert and oriented to person, place, and time. Normal reflexes, muscle tone coordination. No cranial nerve deficit noted. PSYCHIATRIC: Normal mood and affect. Normal behavior. Normal judgment and thought content. CARDIOVASCULAR: Normal heart rate noted, regular rhythm RESPIRATORY: Clear to auscultation bilaterally. Effort and breath sounds normal, no problems with respiration noted. BREASTS: Symmetric in size. No masses, skin changes, nipple drainage, or lymphadenopathy. ABDOMEN: Soft, normal bowel sounds, no distention noted.  No tenderness, rebound or guarding.  PELVIC: Not indicated MUSCULOSKELETAL: Normal range of motion. No tenderness.  No cyanosis, clubbing, or edema.  2+ distal pulses.   Assessment:  Annual gynecologic examination with pap smear STD exposure Plan:  STD testing as per pt request.  Mammogram scheduled Routine preventative health maintenance measures emphasized. Please refer to After Visit Summary for other counseling recommendations.    03/27/2022, MD, FACOG Attending Obstetrician & Gynecologist Center for [facility], [facility]

## 2022-04-12 NOTE — Patient Instructions (Signed)

## 2022-04-13 ENCOUNTER — Encounter: Payer: Self-pay | Admitting: Obstetrics and Gynecology

## 2022-04-13 LAB — CERVICOVAGINAL ANCILLARY ONLY
Bacterial Vaginitis (gardnerella): NEGATIVE
Candida Glabrata: NEGATIVE
Candida Vaginitis: NEGATIVE
Chlamydia: NEGATIVE
Comment: NEGATIVE
Comment: NEGATIVE
Comment: NEGATIVE
Comment: NEGATIVE
Comment: NEGATIVE
Comment: NORMAL
Neisseria Gonorrhea: NEGATIVE
Trichomonas: NEGATIVE

## 2022-04-13 LAB — HIV ANTIBODY (ROUTINE TESTING W REFLEX): HIV Screen 4th Generation wRfx: NONREACTIVE

## 2022-04-13 LAB — RPR: RPR Ser Ql: NONREACTIVE

## 2022-04-13 LAB — HEPATITIS C ANTIBODY: Hep C Virus Ab: NONREACTIVE

## 2022-04-13 LAB — HEPATITIS B SURFACE ANTIGEN: Hepatitis B Surface Ag: NEGATIVE

## 2022-05-10 ENCOUNTER — Other Ambulatory Visit: Payer: Self-pay | Admitting: Obstetrics

## 2022-05-10 DIAGNOSIS — N946 Dysmenorrhea, unspecified: Secondary | ICD-10-CM

## 2022-06-22 ENCOUNTER — Ambulatory Visit (HOSPITAL_BASED_OUTPATIENT_CLINIC_OR_DEPARTMENT_OTHER)
Admission: RE | Admit: 2022-06-22 | Discharge: 2022-06-22 | Disposition: A | Discharge status: Home / Self Care | Payer: Medicaid Other | Source: Ambulatory Visit | Attending: Obstetrics and Gynecology | Admitting: Obstetrics and Gynecology

## 2022-06-22 DIAGNOSIS — Z1231 Encounter for screening mammogram for malignant neoplasm of breast: Secondary | ICD-10-CM | POA: Insufficient documentation

## 2022-08-01 ENCOUNTER — Encounter: Payer: Self-pay | Admitting: Obstetrics & Gynecology

## 2022-08-01 ENCOUNTER — Ambulatory Visit: Payer: Medicaid Other | Admitting: Obstetrics & Gynecology

## 2022-08-01 VITALS — BP 126/80 | HR 91 | Wt 133.0 lb

## 2022-08-01 DIAGNOSIS — R1030 Lower abdominal pain, unspecified: Secondary | ICD-10-CM

## 2022-08-01 NOTE — Progress Notes (Signed)
GYNECOLOGY OFFICE VISIT NOTE  History:   Katrina Wiley is a 41 y.o. [phone] here today for evaluation of isolated episode of lower abdominal pain a few weeks ago, unrelated to her menses.  Had ultrasound in 09/2021 that showed two 1 cm subserosal fibroids, and a corpus luteum cyst.  She is worried about the cyst. Denies any abnormal vaginal discharge, fevers, chills, sweats, dysuria, nausea, vomiting, other GI or GU symptoms or other general symptoms.    Past Medical History:  Diagnosis Date   Abnormal vaginal Pap smear    cryo done in Dr Verdell Carmine office   Allergic rhinitis due to allergen    H/O tubal ligation 04/12/2022   History of migraine headaches    Usually without aura   Hyperlipidemia    no meds, diet controlled   Iron deficiency anemia, unspecified    Iron deficiency; has required IV iron because she did not tolerate oral iron   Preterm labor    Sickle cell trait (HCC)    SVD (spontaneous vaginal delivery)    x 3    Past Surgical History:  Procedure Laterality Date   DILATION AND CURETTAGE OF UTERUS     DILITATION & CURRETTAGE/HYSTROSCOPY WITH HYDROTHERMAL ABLATION N/A 06/13/2017   Procedure: DILATATION & CURETTAGE/HYSTEROSCOPY WITH ATTEMPTED HYDROTHERMAL ABLATION;  Surgeon: Allie Bossier, MD;  Location: [facility];  Service: Gynecology;  Laterality: N/A;   INCISION AND DRAINAGE PERIRECTAL ABSCESS  2008   TUBAL LIGATION     WISDOM TOOTH EXTRACTION      The following portions of the patient's history were reviewed and updated as appropriate: allergies, current medications, past family history, past medical history, past social history, past surgical history and problem list.   Health Maintenance:  Normal pap and negative HRHPV on 01/24/2021.  Normal mammogram on 06/22/2022.   Review of Systems:  Pertinent items noted in HPI and remainder of comprehensive ROS otherwise negative.  Physical Exam:  BP 126/80   Pulse 91   Wt 133 lb (60.3 kg)   LMP 07/20/2022  (Approximate)   BMI 23.56 kg/m  CONSTITUTIONAL: Well-developed, well-nourished female in no acute distress.  HEENT:  Normocephalic, atraumatic. External right and left ear normal. No scleral icterus.  NECK: Normal range of motion, supple, no masses noted on observation SKIN: No rash noted. Not diaphoretic. No erythema. No pallor. MUSCULOSKELETAL: Normal range of motion. No edema noted. NEUROLOGIC: Alert and oriented to person, place, and time. Normal muscle tone coordination. No cranial nerve deficit noted. PSYCHIATRIC: Normal mood and affect. Normal behavior. Normal judgment and thought content. CARDIOVASCULAR: Normal heart rate noted RESPIRATORY: Effort and breath sounds normal, no problems with respiration noted ABDOMEN: No masses noted. No other overt distention noted.   No tenderness on palpation PELVIC: Normal appearing external genitalia; normal urethral meatus; normal appearing distal vaginal mucosa.  No abnormal discharge noted.  Normal uterine size, no other palpable masses, no uterine or adnexal tenderness. Performed in the presence of a chaperone     Assessment and Plan:     1. Lower abdominal pain, unspecified Totally benign examination today, patient reassured. Also reassured about physiologic nature of corpus luteum cysts.  She was told to come back for any worsening or concerning symptoms.  Please refer to After Visit Summary for other counseling recommendations.   Return for any gynecologic concerns.    I spent 15 minutes dedicated to the care of this patient including pre-visit review of records, face to face time with the patient discussing  her conditions and treatments and post visit orders.    Jaynie Collins, MD, FACOG Obstetrician & Gynecologist, [facility] for [employer], [facility]

## 2022-08-20 ENCOUNTER — Other Ambulatory Visit: Payer: Self-pay | Admitting: Obstetrics

## 2022-08-20 DIAGNOSIS — N898 Other specified noninflammatory disorders of vagina: Secondary | ICD-10-CM

## 2022-08-23 ENCOUNTER — Telehealth: Payer: Self-pay

## 2022-08-23 NOTE — Telephone Encounter (Signed)
Returned call and pt states that rx was already sent

## 2022-08-26 ENCOUNTER — Emergency Department (HOSPITAL_BASED_OUTPATIENT_CLINIC_OR_DEPARTMENT_OTHER)
Admission: EM | Admit: 2022-08-26 | Discharge: 2022-08-26 | Disposition: A | Discharge status: Home / Self Care | Payer: Medicaid Other | Attending: Student | Admitting: Student

## 2022-08-26 ENCOUNTER — Encounter (HOSPITAL_BASED_OUTPATIENT_CLINIC_OR_DEPARTMENT_OTHER): Payer: Self-pay | Admitting: Emergency Medicine

## 2022-08-26 ENCOUNTER — Other Ambulatory Visit: Payer: Self-pay

## 2022-08-26 DIAGNOSIS — J029 Acute pharyngitis, unspecified: Secondary | ICD-10-CM | POA: Diagnosis present

## 2022-08-26 DIAGNOSIS — Z20822 Contact with and (suspected) exposure to covid-19: Secondary | ICD-10-CM | POA: Diagnosis not present

## 2022-08-26 DIAGNOSIS — K116 Mucocele of salivary gland: Secondary | ICD-10-CM | POA: Diagnosis not present

## 2022-08-26 DIAGNOSIS — K1379 Other lesions of oral mucosa: Secondary | ICD-10-CM

## 2022-08-26 LAB — GROUP A STREP BY PCR: Group A Strep by PCR: NOT DETECTED

## 2022-08-26 LAB — SARS CORONAVIRUS 2 BY RT PCR: SARS Coronavirus 2 by RT PCR: NEGATIVE

## 2022-08-26 NOTE — Discharge Instructions (Signed)
Your work-up in the ER today was reassuring for acute findings.  I recommend that you get Cepacol throat lozenges over-the-counter to help with your symptoms.  Follow-up with your primary care doctor as needed for continued evaluation and management of your symptoms  Return if development of any new or worsening symptoms.

## 2022-08-26 NOTE — ED Triage Notes (Signed)
Pt arrives pov, c/o sore throat x 1 week, reports "bump" on LT tonsil, swelling noted as well. Also c/o bump on tongue x 1 week. Denies fever

## 2022-08-26 NOTE — ED Provider Notes (Signed)
[place] EMERGENCY DEPARTMENT Provider Note   CSN: [id number] Arrival date & time: 08/26/22  1639     History  No chief complaint on file.   Katrina Wiley is a 41 y.o. female.  Patient with no pertinent past medical history presents today with complaints of sore throat. She states that same began 1 week ago and has been persistent since. She states that she looked in the mirror and saw a bump on her soft palate which was concerning for her. She denies any fevers, chills, cough, congestion. No known sick contacts. She does not smoke. She has been able to eat and drink without difficulty.  The history is provided by the patient. No language interpreter was used.       Home Medications Prior to Admission medications   Medication Sig Start Date End Date Taking? Authorizing Provider  ibuprofen (ADVIL) 800 MG tablet TAKE 1 TABLET BY MOUTH EVERY 8 HOURS AS NEEDED 05/11/22   Shelly Bombard, MD  metroNIDAZOLE (METROGEL) 0.75 % vaginal gel PLACE 1 APPLICATORFUL VAGINALLY AT BEDTIME FOR 5 DAYS. 08/22/22 08/27/22  Shelly Bombard, MD  SUMAtriptan (IMITREX) 50 MG tablet Take by mouth. 10/11/20   [provider]      Allergies    Patient has no known allergies.    Review of Systems   Review of Systems  HENT:  Positive for sore throat.   All other systems reviewed and are negative.   Physical Exam Updated Vital Signs BP 122/80 (BP Location: Left Arm)   Pulse 78   Temp 98.7 F (37.1 C) (Oral)   Resp 18   Ht 5\' 3"  (1.6 m)   Wt 59 kg   LMP 08/22/2022 (Approximate)   SpO2 100%   BMI 23.03 kg/m  Physical Exam Vitals and nursing note reviewed.  Constitutional:      General: She is not in acute distress.    Appearance: Normal appearance. She is normal weight. She is not ill-appearing, toxic-appearing or diaphoretic.  HENT:     Head: Normocephalic and atraumatic.     Mouth/Throat:     Mouth: Mucous membranes are moist.     Pharynx: Oropharynx is  clear. Uvula midline.     Tonsils: No tonsillar exudate or tonsillar abscesses.     Comments: Punctate mucocele located on the left side of the soft palate of the mouth. No signs of infection. No erythema, uvula deviation, tonsillar swelling. Patient tolerating secretions.   Left tongue with small area of irritation but no erythema or swelling. No signs of infection. Same is soft to touch. Cardiovascular:     Rate and Rhythm: Normal rate.  Pulmonary:     Effort: Pulmonary effort is normal. No respiratory distress.  Musculoskeletal:        General: Normal range of motion.     Cervical back: Normal range of motion.  Lymphadenopathy:     Cervical: No cervical adenopathy.  Skin:    General: Skin is warm and dry.  Neurological:     General: No focal deficit present.     Mental Status: She is alert.  Psychiatric:        Mood and Affect: Mood normal.        Behavior: Behavior normal.     ED Results / Procedures / Treatments   Labs (all labs ordered are listed, but only abnormal results are displayed) Labs Reviewed  SARS CORONAVIRUS 2 BY RT PCR  GROUP A STREP BY PCR  EKG None  Radiology No results found.  Procedures Procedures    Medications Ordered in ED Medications - No data to display  ED Course/ Medical Decision Making/ A&P                           Medical Decision Making  Patient presents today with complaints of sore throat.  She is afebrile, nontoxic-appearing, and in no acute distress with reassuring vital signs.  Her COVID swab and strep swab are both negative.  Her tonsils are not swollen and are without exudate.  She does have a punctate area on her left soft palate that is consistent with a mucocele.  No concern for PTA or RPA. Low suspicion for neoplasm. No further emergent  concerns. Recommend supportive care with OTC meds and pcp follow-up.  Patient is understanding and amenable with plan, educated on red flag symptoms that would prompt immediate return.   Patient discharged in stable condition.   Final Clinical Impression(s) / ED Diagnoses Final diagnoses:  Oral mucocele    Rx / DC Orders ED Discharge Orders     None     An After Visit Summary was printed and given to the patient.     Vear Clock 08/26/22 2105    Glendora Score, MD 08/27/22 1319

## 2022-09-19 ENCOUNTER — Telehealth: Payer: Self-pay | Admitting: Hematology

## 2022-09-19 NOTE — Telephone Encounter (Signed)
Scheduled appt per 10/25 referral. Pt already established with Dr. Irene Limbo. Pt is aware of appt date and time. Pt is aware to arrive 15 mins prior to appt time and to bring and updated insurance card. Pt is aware of appt location.

## 2022-10-09 ENCOUNTER — Inpatient Hospital Stay: Payer: Medicaid Other | Attending: Hematology | Admitting: Hematology

## 2022-10-09 VITALS — BP 123/79 | HR 88 | Temp 97.5°F | Resp 14 | Wt 132.2 lb

## 2022-10-09 DIAGNOSIS — N920 Excessive and frequent menstruation with regular cycle: Secondary | ICD-10-CM | POA: Diagnosis not present

## 2022-10-09 DIAGNOSIS — D500 Iron deficiency anemia secondary to blood loss (chronic): Secondary | ICD-10-CM | POA: Insufficient documentation

## 2022-10-09 DIAGNOSIS — D509 Iron deficiency anemia, unspecified: Secondary | ICD-10-CM | POA: Diagnosis not present

## 2022-10-09 NOTE — Progress Notes (Signed)
HEMATOLOGY/ONCOLOGY CLINIC NOTE  Date of Service: 10/09/22  Patient Care Team: [name], MD as PCP - General (Family Medicine)  CHIEF COMPLAINTS/PURPOSE OF CONSULTATION:  Iron Deficiency Anemia  HISTORY OF PRESENTING ILLNESS:   Katrina Wiley is a wonderful 41 y.o. female who has been referred to [place] by Dr [name] for evaluation and management of Iron Deficiency Anemia. The pt reports that she is doing well overall.   The pt reports that she has had four pregnancies and has three children, 25, 69 and 84 years old. She notes that she breast fed for 3 months each time. She notes that she had anemia in her pregnancies.  She notes that she had an endometrial ablation one year ago for her heavy periods which were 6 days in total and were heavy for 3 days, and occurred every 21 days. She denies blood transfusions or receiving IV iron in the past. She denies having fibroids at any time. She took PO iron pills, Ferralet, for a couple months and is not sure if this helped her lab work. She had some constipation with PO iron pills and is hesitant to restarting PO Iron. She denies being told of her anemia pre-puberty.   She notes that her periods are still frequent and are heavy for one day even after her endometrial ablation. She is taking oral contraceptives for cycle regulation.   She notes being very tired often. She notes that she has cravings for all purpose flour.   Her mom had a blood transfusion two years ago for her anemia, and her maternal family has sickle cell trait.    Most recent lab results (12/20/17) of CBC  is as follows: all values are WNL except for HGB at 10.5, HCT at 33.9, MCV at 68.2, MCH at 21.0, MCHC at 30.8, RDW at 18.5.  On review of systems, pt reports fatigue, pica, heavy periods, and denies nose bleeds, gum bleeds, other concerns for blood loss, leg swelling and any other symptoms.   On PMHx the pt reports iron deficiency anemia, sickle cell trait, scoliosis.  Endometrial ablation. She denies drug allergies, seasonal allergies, food allergies, bee stings.  On Social Hx the pt reports social ETOH use, and denies smoking. 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 On Family Hx the pt reports maternal sickle cell trait.   INTERVAL HISTORY  Katrina Wiley is a wonderful 41 y.o. female who is here today for evaluation and management of Iron Deficiency Anemia.   Patient was last seen by me on 09/12/2021 and was doing well without any new medical concerns. She had IV Iron in December 2021.   She has been referred back to [place] because her lab results showed she is iron deficient.  She notes she does not take iron supplements daily. She notes her menstrual periods last about 6 days, which are heavy for the first 3 days. She reports she bled for the whole month of October 2023. Her Gyn notes it was fibroids. She notes she previously had IUD, but it did not help her control her periods. She repots she had an endoplasmic ablation around 4 years ago.   Patient notes she changed her diet and went vegan for a while.   She complains of grade 1 fatigue. She denies new lumps/bumps, back pain, abdominal pain, fever, chills, and leg swelling.   MEDICAL HISTORY:  Past Medical History:  Diagnosis Date   Abnormal vaginal Pap smear    cryo  done in Dr Verdell Carmine office   Allergic rhinitis due to allergen    H/O tubal ligation 04/12/2022   History of migraine headaches    Usually without aura   Hyperlipidemia    no meds, diet controlled   Iron deficiency anemia, unspecified    Iron deficiency; has required IV iron because she did not tolerate oral iron   Preterm labor    Sickle cell trait (HCC)    SVD (spontaneous vaginal delivery)    x 3    SURGICAL HISTORY: Past Surgical History:  Procedure Laterality Date   DILATION AND CURETTAGE OF UTERUS     DILITATION & CURRETTAGE/HYSTROSCOPY WITH HYDROTHERMAL ABLATION N/A 06/13/2017   Procedure: DILATATION  & CURETTAGE/HYSTEROSCOPY WITH ATTEMPTED HYDROTHERMAL ABLATION;  Surgeon: Allie Bossier, MD;  Location: [facility];  Service: Gynecology;  Laterality: N/A;   INCISION AND DRAINAGE PERIRECTAL ABSCESS  2008   TUBAL LIGATION     WISDOM TOOTH EXTRACTION      SOCIAL HISTORY: Social History   Socioeconomic History   Marital status: Single    Spouse name: Not on file   Number of children: 3   Years of education: Not on file   Highest education level: Associate degree: occupational, Scientist, product/process development, or vocational program  Occupational History   Occupation: Programmer, multimedia: [employer]  Tobacco Use   Smoking status: Never   Smokeless tobacco: Never  Vaping Use   Vaping Use: Never used  Substance and Sexual Activity   Alcohol use: Yes    Alcohol/week: 0.0 standard drinks of alcohol    Comment: occasional   Drug use: No   Sexual activity: Yes    Partners: Male    Birth control/protection: None  Other Topics Concern   Not on file  Social History Narrative   Single mother of 3 daughters: Katrina Wiley (27), Katrina Wiley (21), Katrina Wiley (8)   Never smoked.  Rare wine".   No recreational drugs.   Diet: Eats all types of foods except beef and pork.   Exercise: No set routine, but intermittent walking.   Religion: [facility], enjoys traveling.   Social Determinants of Health   Financial Resource Strain: Not on file  Food Insecurity: Not on file  Transportation Needs: Not on file  Physical Activity: Not on file  Stress: Not on file  Social Connections: Not on file  Intimate Partner Violence: Not on file    FAMILY HISTORY: Family History  Problem Relation Age of Onset   Anemia Mother    Hypertension Father    Hypertension Maternal Grandmother    Hyperlipidemia Maternal Grandmother    Stroke Maternal Grandmother    Hypertension Maternal Grandfather    Hyperlipidemia Maternal Grandfather    Diabetes Paternal Grandmother    Cirrhosis Paternal  Grandfather    Breast cancer Other     ALLERGIES:  has No Known Allergies.  MEDICATIONS:  Current Outpatient Medications  Medication Sig Dispense Refill   ibuprofen (ADVIL) 800 MG tablet TAKE 1 TABLET BY MOUTH EVERY 8 HOURS AS NEEDED 30 tablet 5   SUMAtriptan (IMITREX) 50 MG tablet Take by mouth.     No current facility-administered medications for this visit.    REVIEW OF SYSTEMS:   10 Point review of Systems was done is negative except as noted above.  PHYSICAL EXAMINATION: Vitals:   10/09/22 1402  BP: 123/79  Pulse: 88  Resp: 14  Temp: (!) 97.5 F (36.4 C)  SpO2:  99%    Filed Weights   10/09/22 1402  Weight: 132 lb 3.2 oz (60 kg)    .Body mass index is 23.42 kg/m.  Marland Kitchen GENERAL:alert, in no acute distress and comfortable SKIN: no acute rashes, no significant lesions EYES: conjunctiva are pink and non-injected, sclera anicteric OROPHARYNX: MMM, no exudates, no oropharyngeal erythema or ulceration NECK: supple, no JVD LYMPH:  no palpable lymphadenopathy in the cervical, axillary or inguinal regions LUNGS: clear to auscultation b/l with normal respiratory effort HEART: regular rate & rhythm ABDOMEN:  normoactive bowel sounds , non tender, not distended. Extremity: no pedal edema PSYCH: alert & oriented x 3 with fluent speech NEURO: no focal motor/sensory deficits   LABORATORY DATA:  I have reviewed the data as listed     Latest Ref Rng & Units 09/12/2021    1:32 PM 01/03/2021    8:49 AM 11/13/2018    9:52 PM  CBC  WBC 4.0 - 10.5 K/uL 5.0  4.2  5.6   Hemoglobin 12.0 - 15.0 g/dL 31.4  38.8  87.5   Hematocrit 36.0 - 46.0 % 40.8  41.6  37.1   Platelets 150 - 400 K/uL 222  214  194    . CBC    Component Value Date/Time   WBC 5.0 09/12/2021 1332   RBC 5.17 (H) 09/12/2021 1332   HGB 13.5 09/12/2021 1332   HGB 13.6 01/03/2021 0849   HCT 40.8 09/12/2021 1332   PLT 222 09/12/2021 1332   PLT 214 01/03/2021 0849   MCV 78.9 (L) 09/12/2021 1332   MCH 26.1  09/12/2021 1332   MCHC 33.1 09/12/2021 1332   RDW 13.5 09/12/2021 1332   LYMPHSABS 1.7 09/12/2021 1332   MONOABS 0.2 09/12/2021 1332   EOSABS 0.0 09/12/2021 1332   BASOSABS 0.0 09/12/2021 1332    .    Latest Ref Rng & Units 09/12/2021    1:32 PM 01/03/2021    8:49 AM 04/16/2018   11:17 AM  CMP  Glucose 70 - 99 mg/dL 797  82  75   BUN 6 - 20 mg/dL 10  10  11    Creatinine 0.44 - 1.00 mg/dL  2.82  0.60   Sodium 135 - 145 mmol/L 140  139  139   Potassium 3.5 - 5.1 mmol/L 3.4  3.9  3.5   Chloride 98 - 111 mmol/L 106  108  105   CO2 22 - 32 mmol/L 25  24  27    Calcium 8.9 - 10.3 mg/dL 9.7  9.4  9.7   Total Protein 6.5 - 8.1 g/dL 7.5  7.9  8.6   Total Bilirubin 0.3 - 1.2 mg/dL 0.6  0.6  0.5   Alkaline Phos 38 - 126 U/L 56  66  50   AST 15 - 41 U/L 12  17  17    ALT 0 - 44 U/L 8  20  13     . Lab Results  Component Value Date   IRON 81 09/12/2021   TIBC 308 09/12/2021   IRONPCTSAT 26 09/12/2021   (Iron and TIBC)  Lab Results  Component Value Date   FERRITIN 24 09/12/2021    . Lab Results  Component Value Date   IRON 81 09/12/2021   TIBC 308 09/12/2021   IRONPCTSAT 26 09/12/2021   (Iron and TIBC)  Lab Results  Component Value Date   FERRITIN 24 09/12/2021      12/24/17 CBC:    RADIOGRAPHIC STUDIES: I have personally reviewed the  radiological images as listed and agreed with the findings in the report. No results found.  ASSESSMENT & PLAN:  41 y.o. female with  1.Microcytic Anemia due to severe iron deficiency 2. Severe irion deficiency due to heavy menstrual losses. Not responsive to PO iron replacement 3. Pica symptoms related to Iron deficiency anemia - resolved 4. Sickle Cell trait  PLAN: -Discussed the latest lab results, which showed she is iron deficient but she is not anemic.  -Discussed option of IV Iron or increase dosage of iron medication. Patient is interested in receiving with IV Iron. -Continue to follow-up with OB GYN regarding heavy  period.  -Recommend starting B-12 complex.   FOLLOW UP: IV injectafer weekly x 2 doses (Previously received Injectafer) RTC with Dr Candise CheKale with labs in 3 months  The total time spent in the appointment was 20 minutes* .  All of the patient's questions were answered with apparent satisfaction. The patient knows to call the clinic with any problems, questions or concerns.   Colonel BaldI, Param Shah, am acting as a scribe for Wyvonnia LoraGautam Alyna Stensland, MD.  Wyvonnia LoraGautam Markies Mowatt MD MS AAHIVMS [facility] Hematology/Oncology Physician [facility]  .*Total Encounter Time as defined by the Centers for Medicare and Medicaid Services includes, in addition to the face-to-face time of a patient visit (documented in the note above) non-face-to-face time: obtaining and reviewing outside history, ordering and reviewing medications, tests or procedures, care coordination (communications with other health care professionals or caregivers) and documentation in the medical record.

## 2022-10-16 ENCOUNTER — Encounter: Payer: Self-pay | Admitting: Hematology

## 2022-10-24 ENCOUNTER — Ambulatory Visit: Payer: Medicaid Other | Admitting: Family Medicine

## 2022-10-26 ENCOUNTER — Inpatient Hospital Stay: Payer: Medicaid Other | Attending: Hematology

## 2022-10-26 VITALS — BP 119/69 | HR 88 | Temp 98.7°F | Resp 16

## 2022-10-26 DIAGNOSIS — N920 Excessive and frequent menstruation with regular cycle: Secondary | ICD-10-CM | POA: Insufficient documentation

## 2022-10-26 DIAGNOSIS — D509 Iron deficiency anemia, unspecified: Secondary | ICD-10-CM

## 2022-10-26 DIAGNOSIS — D500 Iron deficiency anemia secondary to blood loss (chronic): Secondary | ICD-10-CM | POA: Diagnosis present

## 2022-10-26 MED ORDER — LORATADINE 10 MG PO TABS
10.0000 mg | ORAL_TABLET | Freq: Once | ORAL | Status: AC
  Administered 2022-10-26: 10 mg via ORAL
  Filled 2022-10-26: qty 1

## 2022-10-26 MED ORDER — SODIUM CHLORIDE 0.9 % IV SOLN: Freq: Once | INTRAVENOUS | Status: AC

## 2022-10-26 MED ORDER — SODIUM CHLORIDE 0.9 % IV SOLN
750.0000 mg | Freq: Once | INTRAVENOUS | Status: AC
  Administered 2022-10-26: 750 mg via INTRAVENOUS
  Filled 2022-10-26: qty 15

## 2022-10-26 MED ORDER — ACETAMINOPHEN 325 MG PO TABS
650.0000 mg | ORAL_TABLET | Freq: Once | ORAL | Status: AC
  Administered 2022-10-26: 650 mg via ORAL
  Filled 2022-10-26: qty 2

## 2022-10-26 NOTE — Patient Instructions (Signed)
Ferric Carboxymaltose Injection What is this medication? FERRIC CARBOXYMALTOSE (FER ik kar BOX ee MAWL tose) treats low levels of iron in your body (iron deficiency anemia). Iron is a mineral that plays an important role in making red blood cells, which carry oxygen from your lungs to the rest of your body. This medicine may be used for other purposes; ask your health care provider or pharmacist if you have questions. COMMON BRAND NAME(S): Injectafer What should I tell my care team before I take this medication? They need to know if you have any of these conditions: High blood pressure Hyperparathyroidism Inflammatory bowel disease Low levels of vitamin D in your blood Previously received ferric carboxymaltose Problems absorbing certain vitamins or phosphate in your body An unusual or allergic reaction to iron, other medications, foods, dyes, or preservatives Pregnant or trying to get pregnant Breastfeeding How should I use this medication? This medication is injected into a vein. It is given by your care team in a hospital or clinic setting. Talk to your care team about the use of this medication in children. While it may be given to children as young as 1 year for selected conditions, precautions do apply. Overdosage: If you think you have taken too much of this medicine contact a poison control center or emergency room at once. NOTE: This medicine is only for you. Do not share this medicine with others. What if I miss a dose? Keep appointments for follow-up doses. It is important not to miss your dose. Call your care team if you are unable to keep an appointment. What may interact with this medication? Do not take this medication with any of the following: Deferoxamine Dimercaprol Other iron products This list may not describe all possible interactions. Give your health care provider a list of all the medicines, herbs, non-prescription drugs, or dietary supplements you use. Also tell  them if you smoke, drink alcohol, or use illegal drugs. Some items may interact with your medicine. What should I watch for while using this medication? Visit your care team for regular checks on your progress. Tell your care team if your symptoms do not start to get better or if they get worse. You may need blood work while you are taking this medication. You may need to eat more foods that contain iron. Talk to your care team. Foods that contain iron include whole grains/cereals, dried fruits, beans, or peas, leafy green vegetables, and organ meats (liver, kidney). What side effects may I notice from receiving this medication? Side effects that you should report to your care team as soon as possible: Allergic reactions--skin rash, itching, hives, swelling of the face, lips, tongue, or throat Increase in blood pressure Low blood pressure--dizziness, feeling faint or lightheaded, blurry vision Low phosphorus level--fatigue, muscle weakness or pain, bone or joint pain, bone fractures Shortness of breath Side effects that usually do not require medical attention (report to your care team if they continue or are bothersome): Flushing Headache Nausea Pain, redness, or irritation at injection site Vomiting This list may not describe all possible side effects. Call your doctor for medical advice about side effects. You may report side effects to FDA at 1-800-FDA-1088. Where should I keep my medication? This medication is given in a hospital or clinic. It will not be stored at home. NOTE: This sheet is a summary. It may not cover all possible information. If you have questions about this medicine, talk to your doctor, pharmacist, or health care provider.  2023 Elsevier/Gold   Standard (2022-04-30 00:00:00)  

## 2022-10-30 ENCOUNTER — Other Ambulatory Visit (HOSPITAL_COMMUNITY)
Admission: RE | Admit: 2022-10-30 | Discharge: 2022-10-30 | Disposition: A | Discharge status: Home / Self Care | Payer: Medicaid Other | Source: Ambulatory Visit | Attending: Obstetrics | Admitting: Obstetrics

## 2022-10-30 ENCOUNTER — Encounter: Payer: Self-pay | Admitting: Obstetrics

## 2022-10-30 ENCOUNTER — Ambulatory Visit (INDEPENDENT_AMBULATORY_CARE_PROVIDER_SITE_OTHER): Payer: Medicaid Other | Admitting: Obstetrics

## 2022-10-30 VITALS — BP 117/77 | HR 78 | Ht 63.0 in | Wt 131.0 lb

## 2022-10-30 DIAGNOSIS — N898 Other specified noninflammatory disorders of vagina: Secondary | ICD-10-CM

## 2022-10-30 DIAGNOSIS — R102 Pelvic and perineal pain: Secondary | ICD-10-CM

## 2022-10-30 DIAGNOSIS — Z01419 Encounter for gynecological examination (general) (routine) without abnormal findings: Secondary | ICD-10-CM | POA: Insufficient documentation

## 2022-10-30 MED ORDER — DOXYCYCLINE HYCLATE 100 MG PO CAPS: 100.0000 mg | ORAL_CAPSULE | Freq: Two times a day (BID) | ORAL | 0 refills | Status: DC

## 2022-10-30 MED ORDER — METRONIDAZOLE 500 MG PO TABS: 500.0000 mg | ORAL_TABLET | Freq: Two times a day (BID) | ORAL | 0 refills | Status: DC

## 2022-10-30 NOTE — Progress Notes (Addendum)
41 y.o presents for vaginal discharge, cramps 5/10 x 2 weeks.  Denies fever, chills, NV, odor.  Last Mammogram 06/22/2022 Last PAP 01/24/2021

## 2022-10-30 NOTE — Progress Notes (Signed)
Patient ID: Katrina Wiley, female   DOB: Aug 12, 1981, 41 y.o.   MRN: [id number]  Chief Complaint  Patient presents with   Vaginal Discharge    HPI Katrina Wiley is a 41 y.o. female.  Complains of cramping off and on and vaginal discharge for the past month.  Periods have been irreular, and sometimes prolonged. HPI  Past Medical History:  Diagnosis Date   Abnormal vaginal Pap smear    cryo done in Dr Verdell Carmine office   Allergic rhinitis due to allergen    H/O tubal ligation 04/12/2022   History of migraine headaches    Usually without aura   Hyperlipidemia    no meds, diet controlled   Iron deficiency anemia, unspecified    Iron deficiency; has required IV iron because she did not tolerate oral iron   Preterm labor    Sickle cell trait (HCC)    SVD (spontaneous vaginal delivery)    x 3    Past Surgical History:  Procedure Laterality Date   DILATION AND CURETTAGE OF UTERUS     DILITATION & CURRETTAGE/HYSTROSCOPY WITH HYDROTHERMAL ABLATION N/A 06/13/2017   Procedure: DILATATION & CURETTAGE/HYSTEROSCOPY WITH ATTEMPTED HYDROTHERMAL ABLATION;  Surgeon: Allie Bossier, MD;  Location: [facility];  Service: Gynecology;  Laterality: N/A;   INCISION AND DRAINAGE PERIRECTAL ABSCESS  2008   TUBAL LIGATION     WISDOM TOOTH EXTRACTION      Family History  Problem Relation Age of Onset   Anemia Mother    Hypertension Father    Hypertension Maternal Grandmother    Hyperlipidemia Maternal Grandmother    Stroke Maternal Grandmother    Hypertension Maternal Grandfather    Hyperlipidemia Maternal Grandfather    Diabetes Paternal Grandmother    Cirrhosis Paternal Grandfather    Breast cancer Other     Social History Social History   Tobacco Use   Smoking status: Never   Smokeless tobacco: Never  Vaping Use   Vaping Use: Never used  Substance Use Topics   Alcohol use: Yes    Alcohol/week: 0.0 standard drinks of alcohol    Comment: occasional   Drug use: No    No Known  Allergies  Current Outpatient Medications  Medication Sig Dispense Refill   doxycycline (VIBRAMYCIN) 100 MG capsule Take 1 capsule (100 mg total) by mouth 2 (two) times daily. 28 capsule 0   metroNIDAZOLE (FLAGYL) 500 MG tablet Take 1 tablet (500 mg total) by mouth 2 (two) times daily. 28 tablet 0   ibuprofen (ADVIL) 800 MG tablet TAKE 1 TABLET BY MOUTH EVERY 8 HOURS AS NEEDED 30 tablet 5   SUMAtriptan (IMITREX) 50 MG tablet Take by mouth.     No current facility-administered medications for this visit.    Review of Systems Review of Systems Constitutional: negative for fatigue and weight loss Respiratory: negative for cough and wheezing Cardiovascular: negative for chest pain, fatigue and palpitations Gastrointestinal: negative for abdominal pain and change in bowel habits Genitourinary:positive for abnormal periods, pelvic pain and vaginal discharge Integument/breast: negative for nipple discharge Musculoskeletal:negative for myalgias Neurological: negative for gait problems and tremors Behavioral/Psych: negative for abusive relationship, depression Endocrine: negative for temperature intolerance      Blood pressure 117/77, pulse 78, height 5\' 3"  (1.6 m), weight 131 lb (59.4 kg), last menstrual period 10/12/2022.  Physical Exam Physical Exam General:   Alert and no distress  Skin:   no rash or abnormalities  Lungs:   clear to auscultation bilaterally  Heart:  regular rate and rhythm, S1, S2 normal, no murmur, click, rub or gallop  Breasts:   normal without suspicious masses, skin or nipple changes or axillary nodes  Abdomen:  normal findings: no organomegaly, soft, non-tender and no hernia  Pelvis:  External genitalia: normal general appearance Urinary system: urethral meatus normal and bladder without fullness, nontender Vaginal: normal without tenderness, induration or masses Cervix: normal appearance Adnexa: normal bimanual exam Uterus: anteverted and non-tender, normal  size    I have spent a total of 20 minutes of face-to-face time, excluding clinical staff time, reviewing notes and preparing to see patient, ordering tests and/or medications, and counseling the patient.   Data Reviewed Labs   Assessment     1. Encounter for gynecological examination with Papanicolaou smear of cervix X: - Cytology - PAP( [place])  2. Vaginal discharge Rx: - Cervicovaginal ancillary only( [place])  3. Pelvic pain, possible PID Rx: - US PELVIC COMPLETE WITH TRANSVAGINAL; Future - doxycycline (VIBRAMYCIN) 100 MG capsule; Take 1 capsule (100 mg total) by mouth 2 (two) times daily.  Dispense: 28 capsule; Refill: 0 - metroNIDAZOLE (FLAGYL) 500 MG tablet; Take 1 tablet (500 mg total) by mouth 2 (two) times daily.  Dispense: 28 tablet; Refill: 0     Plan   .Treat with outpatient regimen for PID    Follow up in 2 weeks  Orders Placed This Encounter  Procedures   US PELVIC COMPLETE WITH TRANSVAGINAL    Standing Status:   Future    Standing Expiration Date:   10/31/2023    Order Specific Question:   Reason for Exam (SYMPTOM  OR DIAGNOSIS REQUIRED)    Answer:   Pelvic pain    Order Specific Question:   Preferred imaging location?    Answer:   [facility]     Shelly Bombard, MD 10/30/2022 2:30 PM

## 2022-10-31 LAB — CERVICOVAGINAL ANCILLARY ONLY
Bacterial Vaginitis (gardnerella): NEGATIVE
Candida Glabrata: NEGATIVE
Candida Vaginitis: NEGATIVE
Chlamydia: NEGATIVE
Comment: NEGATIVE
Comment: NEGATIVE
Comment: NEGATIVE
Comment: NEGATIVE
Comment: NEGATIVE
Comment: NORMAL
Neisseria Gonorrhea: NEGATIVE
Trichomonas: NEGATIVE

## 2022-11-01 LAB — CYTOLOGY - PAP
Adequacy: ABSENT
Comment: NEGATIVE
Diagnosis: NEGATIVE
High risk HPV: NEGATIVE

## 2022-11-02 ENCOUNTER — Inpatient Hospital Stay: Payer: Medicaid Other

## 2022-11-02 VITALS — BP 110/68 | HR 86 | Temp 98.4°F | Resp 18

## 2022-11-02 DIAGNOSIS — D509 Iron deficiency anemia, unspecified: Secondary | ICD-10-CM

## 2022-11-02 DIAGNOSIS — D500 Iron deficiency anemia secondary to blood loss (chronic): Secondary | ICD-10-CM | POA: Diagnosis not present

## 2022-11-02 MED ORDER — SODIUM CHLORIDE 0.9 % IV SOLN: Freq: Once | INTRAVENOUS | Status: AC

## 2022-11-02 MED ORDER — ACETAMINOPHEN 325 MG PO TABS
650.0000 mg | ORAL_TABLET | Freq: Once | ORAL | Status: AC
  Administered 2022-11-02: 650 mg via ORAL
  Filled 2022-11-02: qty 2

## 2022-11-02 MED ORDER — LORATADINE 10 MG PO TABS
10.0000 mg | ORAL_TABLET | Freq: Once | ORAL | Status: AC
  Administered 2022-11-02: 10 mg via ORAL
  Filled 2022-11-02: qty 1

## 2022-11-02 MED ORDER — SODIUM CHLORIDE 0.9 % IV SOLN
750.0000 mg | Freq: Once | INTRAVENOUS | Status: AC
  Administered 2022-11-02: 750 mg via INTRAVENOUS
  Filled 2022-11-02: qty 15

## 2022-11-02 NOTE — Patient Instructions (Signed)

## 2022-11-09 ENCOUNTER — Ambulatory Visit (HOSPITAL_BASED_OUTPATIENT_CLINIC_OR_DEPARTMENT_OTHER)
Admission: RE | Admit: 2022-11-09 | Discharge: 2022-11-09 | Disposition: A | Discharge status: Home / Self Care | Payer: Medicaid Other | Source: Ambulatory Visit | Attending: Obstetrics | Admitting: Obstetrics

## 2022-11-09 DIAGNOSIS — R102 Pelvic and perineal pain: Secondary | ICD-10-CM | POA: Diagnosis present

## 2022-12-23 ENCOUNTER — Other Ambulatory Visit: Payer: Self-pay

## 2022-12-23 ENCOUNTER — Encounter (HOSPITAL_BASED_OUTPATIENT_CLINIC_OR_DEPARTMENT_OTHER): Payer: Self-pay | Admitting: Emergency Medicine

## 2022-12-23 ENCOUNTER — Emergency Department (HOSPITAL_BASED_OUTPATIENT_CLINIC_OR_DEPARTMENT_OTHER): Payer: Medicaid Other

## 2022-12-23 ENCOUNTER — Emergency Department (HOSPITAL_BASED_OUTPATIENT_CLINIC_OR_DEPARTMENT_OTHER)
Admission: EM | Admit: 2022-12-23 | Discharge: 2022-12-23 | Disposition: A | Discharge status: Home / Self Care | Payer: Medicaid Other | Attending: Emergency Medicine | Admitting: Emergency Medicine

## 2022-12-23 DIAGNOSIS — R059 Cough, unspecified: Secondary | ICD-10-CM | POA: Diagnosis present

## 2022-12-23 DIAGNOSIS — U071 COVID-19: Secondary | ICD-10-CM | POA: Insufficient documentation

## 2022-12-23 LAB — RESP PANEL BY RT-PCR (RSV, FLU A&B, COVID)  RVPGX2
Influenza A by PCR: NEGATIVE
Influenza B by PCR: NEGATIVE
Resp Syncytial Virus by PCR: NEGATIVE
SARS Coronavirus 2 by RT PCR: POSITIVE — AB

## 2022-12-23 LAB — GROUP A STREP BY PCR: Group A Strep by PCR: NOT DETECTED

## 2022-12-23 MED ORDER — MOLNUPIRAVIR EUA 200MG CAPSULE: 4.0000 | ORAL_CAPSULE | Freq: Two times a day (BID) | ORAL | 0 refills | Status: AC

## 2022-12-23 MED ORDER — HYDROCODONE BIT-HOMATROP MBR 5-1.5 MG/5ML PO SOLN: 5.0000 mL | Freq: Four times a day (QID) | ORAL | 0 refills | Status: DC | PRN

## 2022-12-23 MED ORDER — ACETAMINOPHEN 325 MG PO TABS
650.0000 mg | ORAL_TABLET | Freq: Once | ORAL | Status: AC | PRN
  Administered 2022-12-23: 650 mg via ORAL
  Filled 2022-12-23: qty 2

## 2022-12-23 NOTE — ED Triage Notes (Signed)
Pt arrives pov, c/o sore throat and congestion x 2 days. Endorses CP last night with fever and chills. Denies CP today

## 2022-12-23 NOTE — Discharge Instructions (Addendum)
Your exam today is overall reassuring.  I have sent 2 medications into the pharmacy for you.  1 is molnupiravir 89 from the infection from worsening to require hospitalization.  Hycodan cough syrup is to suppress the cough to keep you from having severe coughing spells to keep the chest pain from worsening.  We did discuss obtaining additional blood work to ensure that your chest pain was not heart attack related, or inflammation of the heart since then.  You stated you would rather not wait for this at this time and will return if you have worsening symptoms.  Any concerning symptoms return to the emergency department.

## 2022-12-23 NOTE — ED Provider Notes (Signed)
[facility] [place] Provider Note   CSN: [id number] Arrival date & time: 12/23/22  1701     History  Chief Complaint  Patient presents with   Cough    Katrina Wiley is a 42 y.o. female.  23-year-old female presents today for evaluation of URI symptoms since Friday.  States she had an episode of chest pain yesterday which was worse with exertion such as coughing, as well as with palpation.  She has been having quite a bit of coughing spells.  Cough is nonproductive.  Denies any known sick contacts.  Denies associated shortness of breath.  The history is provided by the patient. No language interpreter was used.       Home Medications Prior to Admission medications   Medication Sig Start Date End Date Taking? Authorizing Provider  HYDROcodone bit-homatropine (HYCODAN) 5-1.5 MG/5ML syrup Take 5 mLs by mouth every 6 (six) hours as needed for cough. 12/23/22  Yes Jaevion Goto, PA-C  molnupiravir EUA (LAGEVRIO) 200 mg CAPS capsule Take 4 capsules (800 mg total) by mouth 2 (two) times daily for 5 days. 12/23/22 12/28/22 Yes Kymberly Blomberg, PA-C  doxycycline (VIBRAMYCIN) 100 MG capsule Take 1 capsule (100 mg total) by mouth 2 (two) times daily. 10/30/22   Shelly Bombard, MD  ibuprofen (ADVIL) 800 MG tablet TAKE 1 TABLET BY MOUTH EVERY 8 HOURS AS NEEDED 05/11/22   Shelly Bombard, MD  metroNIDAZOLE (FLAGYL) 500 MG tablet Take 1 tablet (500 mg total) by mouth 2 (two) times daily. 10/30/22   Shelly Bombard, MD  SUMAtriptan (IMITREX) 50 MG tablet Take by mouth. 10/11/20   [provider]      Allergies    Patient has no known allergies.    Review of Systems   Review of Systems  Constitutional:  Positive for chills and fever.  HENT:  Positive for sore throat. Negative for trouble swallowing and voice change.   Respiratory:  Positive for cough. Negative for shortness of breath.   Cardiovascular:  Positive for chest pain (now resolved).  Negative for palpitations and leg swelling.  Neurological:  Negative for light-headedness.  All other systems reviewed and are negative.   Physical Exam Updated Vital Signs BP 112/82 (BP Location: Left Arm)   Pulse (!) 103   Temp 99.2 F (37.3 C) (Oral)   Resp 18   Ht 5\' 3"  (1.6 m)   Wt 60.3 kg   LMP 12/16/2022   SpO2 99%   BMI 23.56 kg/m  Physical Exam Vitals and nursing note reviewed.  Constitutional:      General: She is not in acute distress.    Appearance: Normal appearance. She is not ill-appearing.  HENT:     Head: Normocephalic and atraumatic.     Nose: Nose normal.  Eyes:     General: No scleral icterus.    Extraocular Movements: Extraocular movements intact.     Conjunctiva/sclera: Conjunctivae normal.  Cardiovascular:     Rate and Rhythm: Normal rate and regular rhythm.     Pulses: Normal pulses.     Heart sounds: Normal heart sounds.  Pulmonary:     Effort: Pulmonary effort is normal. No respiratory distress.     Breath sounds: Normal breath sounds. No wheezing or rales.  Abdominal:     General: There is no distension.     Palpations: Abdomen is soft.     Tenderness: There is no abdominal tenderness. There is no guarding.  Musculoskeletal:  General: Normal range of motion.     Cervical back: Normal range of motion.  Skin:    General: Skin is warm and dry.  Neurological:     General: No focal deficit present.     Mental Status: She is alert and oriented to person, place, and time. Mental status is at baseline.     ED Results / Procedures / Treatments   Labs (all labs ordered are listed, but only abnormal results are displayed) Labs Reviewed  RESP PANEL BY RT-PCR (RSV, FLU A&B, COVID)  RVPGX2 - Abnormal; Notable for the following components:      Result Value   SARS Coronavirus 2 by RT PCR POSITIVE (*)    All other components within normal limits  GROUP A STREP BY PCR    EKG None  Radiology DG Chest 2 View  Result Date:  12/23/2022 CLINICAL DATA:  Cough and fever. EXAM: CHEST - 2 VIEW COMPARISON:  Chest x-ray July 26, 2020 FINDINGS: The heart size and mediastinal contours are within normal limits. Both lungs are clear. The visualized skeletal structures are unremarkable. IMPRESSION: No active cardiopulmonary disease. Electronically Signed   By: Dorise Bullion III M.D.   On: 12/23/2022 17:50    Procedures Procedures    Medications Ordered in ED Medications  acetaminophen (TYLENOL) tablet 650 mg (650 mg Oral Given 12/23/22 1737)    ED Course/ Medical Decision Making/ A&P                             Medical Decision Making Amount and/or Complexity of Data Reviewed Radiology: ordered.  Risk OTC drugs. Prescription drug management.   42 year old female presents today for evaluation of URI symptoms including chest pain yesterday.  Chest pain now improved.  Yesterday it was worse with exertion, as well as palpation.  Does not sound typical for ACS.  EKG without acute ischemic changes.  Chest x-ray without acute cardiopulmonary process.  She is COVID-positive.  Strep negative.  Discussed obtaining additional blood work to evaluate for myocarditis, ACS versus symptomatic management with strict return precautions.  After shared decision making patient prefers to return for worsening symptoms.  I feel this is reasonable at this time.  Patient is appropriate for discharge.  Discharged in stable condition.  Return precaution discussed.  Patient voices understanding and is in agreement with plan.   Final Clinical Impression(s) / ED Diagnoses Final diagnoses:  [id number]    Rx / DC Orders ED Discharge Orders          Ordered    molnupiravir EUA (LAGEVRIO) 200 mg CAPS capsule  2 times daily        12/23/22 2020    HYDROcodone bit-homatropine (HYCODAN) 5-1.5 MG/5ML syrup  Every 6 hours PRN        12/23/22 2020              Evlyn Courier, PA-C 12/23/22 2035    Tegeler, Gwenyth Allegra, MD 12/23/22  2304

## 2023-01-11 ENCOUNTER — Inpatient Hospital Stay: Payer: Medicaid Other

## 2023-01-11 ENCOUNTER — Inpatient Hospital Stay: Payer: Medicaid Other | Admitting: Hematology

## 2023-01-24 ENCOUNTER — Other Ambulatory Visit: Payer: Self-pay

## 2023-01-24 DIAGNOSIS — D509 Iron deficiency anemia, unspecified: Secondary | ICD-10-CM

## 2023-01-25 ENCOUNTER — Inpatient Hospital Stay (HOSPITAL_BASED_OUTPATIENT_CLINIC_OR_DEPARTMENT_OTHER): Payer: Medicaid Other | Admitting: Hematology

## 2023-01-25 ENCOUNTER — Inpatient Hospital Stay: Payer: Medicaid Other | Attending: Hematology

## 2023-01-25 VITALS — BP 120/83 | HR 90 | Temp 97.5°F | Resp 20 | Wt 136.8 lb

## 2023-01-25 DIAGNOSIS — N920 Excessive and frequent menstruation with regular cycle: Secondary | ICD-10-CM | POA: Diagnosis not present

## 2023-01-25 DIAGNOSIS — D509 Iron deficiency anemia, unspecified: Secondary | ICD-10-CM

## 2023-01-25 DIAGNOSIS — D500 Iron deficiency anemia secondary to blood loss (chronic): Secondary | ICD-10-CM | POA: Insufficient documentation

## 2023-01-25 LAB — CMP (CANCER CENTER ONLY)
ALT: 11 U/L (ref 0–44)
AST: 14 U/L — ABNORMAL LOW (ref 15–41)
Albumin: 4.5 g/dL (ref 3.5–5.0)
Alkaline Phosphatase: 61 U/L (ref 38–126)
Anion gap: 8 (ref 5–15)
BUN: 6 mg/dL (ref 6–20)
CO2: 28 mmol/L (ref 22–32)
Calcium: 9.6 mg/dL (ref 8.9–10.3)
Chloride: 102 mmol/L (ref 98–111)
Creatinine: 0.68 mg/dL (ref 0.44–1.00)
GFR, Estimated: >60 mL/min (ref >60)
Glucose, Bld: 104 mg/dL — ABNORMAL HIGH (ref 70–99)
Potassium: 3.5 mmol/L (ref 3.5–5.1)
Sodium: 138 mmol/L (ref 135–145)
Total Bilirubin: 0.5 mg/dL (ref 0.3–1.2)
Total Protein: 7.6 g/dL (ref 6.5–8.1)

## 2023-01-25 LAB — CBC WITH DIFFERENTIAL (CANCER CENTER ONLY)
Abs Immature Granulocytes: 0.01 10*3/uL (ref 0.00–0.07)
Basophils Absolute: 0 10*3/uL (ref 0.0–0.1)
Basophils Relative: 0 %
Eosinophils Absolute: 0 10*3/uL (ref 0.0–0.5)
Eosinophils Relative: 1 %
HCT: 40.8 % (ref 36.0–46.0)
Hemoglobin: 13.4 g/dL (ref 12.0–15.0)
Immature Granulocytes: 0 %
Lymphocytes Relative: 36 %
Lymphs Abs: 2 10*3/uL (ref 0.7–4.0)
MCH: 26.4 pg (ref 26.0–34.0)
MCHC: 32.8 g/dL (ref 30.0–36.0)
MCV: 80.5 fL (ref 80.0–100.0)
Monocytes Absolute: 0.3 10*3/uL (ref 0.1–1.0)
Monocytes Relative: 5 %
Neutro Abs: 3.2 10*3/uL (ref 1.7–7.7)
Neutrophils Relative %: 58 %
Platelet Count: 195 10*3/uL (ref 150–400)
RBC: 5.07 MIL/uL (ref 3.87–5.11)
RDW: 15.4 % (ref 11.5–15.5)
WBC Count: 5.6 10*3/uL (ref 4.0–10.5)
nRBC: 0 % (ref 0.0–0.2)

## 2023-01-25 LAB — VITAMIN B12: Vitamin B-12: 314 pg/mL (ref 180–914)

## 2023-01-25 LAB — IRON AND IRON BINDING CAPACITY (CC-WL,HP ONLY)
Iron: 107 ug/dL (ref 28–170)
Saturation Ratios: 35 % — ABNORMAL HIGH (ref 10.4–31.8)
TIBC: 307 ug/dL (ref 250–450)
UIBC: 200 ug/dL (ref 148–442)

## 2023-01-25 LAB — FERRITIN: Ferritin: 118 ng/mL (ref 11–307)

## 2023-01-25 NOTE — Progress Notes (Signed)
HEMATOLOGY/ONCOLOGY CLINIC NOTE  Date of Service: 01/25/23  Patient Care Team: Donald Prose, MD as PCP - General (Family Medicine)  CHIEF COMPLAINTS/PURPOSE OF CONSULTATION:  Iron Deficiency Anemia  HISTORY OF PRESENTING ILLNESS:   Katrina Wiley is a wonderful 42 y.o. female who has been referred to [place] by Dr Donald Prose for evaluation and management of Iron Deficiency Anemia. The pt reports that she is doing well overall.   The pt reports that she has had four pregnancies and has three children, 101, 78 and 43 years old. She notes that she breast fed for 3 months each time. She notes that she had anemia in her pregnancies.  She notes that she had an endometrial ablation one year ago for her heavy periods which were 6 days in total and were heavy for 3 days, and occurred every 21 days. She denies blood transfusions or receiving IV iron in the past. She denies having fibroids at any time. She took PO iron pills, Ferralet, for a couple months and is not sure if this helped her lab work. She had some constipation with PO iron pills and is hesitant to restarting PO Iron. She denies being told of her anemia pre-puberty.   She notes that her periods are still frequent and are heavy for one day even after her endometrial ablation. She is taking oral contraceptives for cycle regulation.   She notes being very tired often. She notes that she has cravings for all purpose flour.   Her mom had a blood transfusion two years ago for her anemia, and her maternal family has sickle cell trait.    Most recent lab results (12/20/17) of CBC  is as follows: all values are WNL except for HGB at 10.5, HCT at 33.9, MCV at 68.2, MCH at 21.0, MCHC at 30.8, RDW at 18.5.  On review of systems, pt reports fatigue, pica, heavy periods, and denies nose bleeds, gum bleeds, other concerns for blood loss, leg swelling and any other symptoms.   On PMHx the pt reports iron deficiency anemia, sickle cell trait, scoliosis.  Endometrial ablation. She denies drug allergies, seasonal allergies, food allergies, bee stings.  On Social Hx the pt reports social ETOH use, and denies smoking. 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 On Family Hx the pt reports maternal sickle cell trait.   INTERVAL HISTORY  Katrina Wiley is a wonderful 42 y.o. female who is here today for evaluation and management of Iron Deficiency Anemia. Patient was last seen by me on 10/09/2022 and complained of grade 1 fatigue and heavy menstrual cycles.  Today, she reports that she has tolerated her last iron infusions well. She reports that her menstrual cycles have been more regular recently and her recent infection has resolved. She typically sees her PCP once a year and her next appointment is in October, 2024.  Her weight has been fairly stable and she denies any unexpected weight loss. She also denies any abdominal pain, leg swelling, nose bleeds, blood in stools, gum bleeds, fatigue, lightheadedness, or dizziness. She does take Ibuprofen less than once a month.   MEDICAL HISTORY:  Past Medical History:  Diagnosis Date   Abnormal vaginal Pap smear    cryo done in Dr Jacelyn Grip office   Allergic rhinitis due to allergen    H/O tubal ligation 04/12/2022   History of migraine headaches    Usually without aura   Hyperlipidemia    no meds, diet controlled  Iron deficiency anemia, unspecified    Iron deficiency; has required IV iron because she did not tolerate oral iron   Preterm labor    Sickle cell trait (HCC)    SVD (spontaneous vaginal delivery)    x 3    SURGICAL HISTORY: Past Surgical History:  Procedure Laterality Date   DILATION AND CURETTAGE OF UTERUS     DILITATION & CURRETTAGE/HYSTROSCOPY WITH HYDROTHERMAL ABLATION N/A 06/13/2017   Procedure: DILATATION & CURETTAGE/HYSTEROSCOPY WITH ATTEMPTED HYDROTHERMAL ABLATION;  Surgeon: Emily Filbert, MD;  Location: [facility];  Service: Gynecology;  Laterality: N/A;    INCISION AND DRAINAGE PERIRECTAL ABSCESS  2008   TUBAL LIGATION     WISDOM TOOTH EXTRACTION      SOCIAL HISTORY: Social History   Socioeconomic History   Marital status: Single    Spouse name: Not on file   Number of children: 3   Years of education: Not on file   Highest education level: Associate degree: occupational, Hotel manager, or vocational program  Occupational History   Occupation: Archivist: [employer]  Tobacco Use   Smoking status: Never   Smokeless tobacco: Never  Vaping Use   Vaping Use: Never used  Substance and Sexual Activity   Alcohol use: Yes    Alcohol/week: 0.0 standard drinks of alcohol    Comment: occasional   Drug use: No   Sexual activity: Yes    Partners: Male    Birth control/protection: None  Other Topics Concern   Not on file  Social History Narrative   Single mother of 3 daughters: Enis Gash (20), Tanasiah (17), [place] (8)   Never smoked.  Rare wine".   No recreational drugs.   Diet: Eats all types of foods except beef and pork.   Exercise: No set routine, but intermittent walking.   Religion: [facility], enjoys traveling.   Social Determinants of Health   Financial Resource Strain: Not on file  Food Insecurity: Not on file  Transportation Needs: Not on file  Physical Activity: Not on file  Stress: Not on file  Social Connections: Not on file  Intimate Partner Violence: Not on file    FAMILY HISTORY: Family History  Problem Relation Age of Onset   Anemia Mother    Hypertension Father    Hypertension Maternal Grandmother    Hyperlipidemia Maternal Grandmother    Stroke Maternal Grandmother    Hypertension Maternal Grandfather    Hyperlipidemia Maternal Grandfather    Diabetes Paternal Grandmother    Cirrhosis Paternal Grandfather    Breast cancer Other     ALLERGIES:  has No Known Allergies.  MEDICATIONS:  Current Outpatient Medications  Medication Sig Dispense Refill    doxycycline (VIBRAMYCIN) 100 MG capsule Take 1 capsule (100 mg total) by mouth 2 (two) times daily. 28 capsule 0   HYDROcodone bit-homatropine (HYCODAN) 5-1.5 MG/5ML syrup Take 5 mLs by mouth every 6 (six) hours as needed for cough. 120 mL 0   ibuprofen (ADVIL) 800 MG tablet TAKE 1 TABLET BY MOUTH EVERY 8 HOURS AS NEEDED 30 tablet 5   metroNIDAZOLE (FLAGYL) 500 MG tablet Take 1 tablet (500 mg total) by mouth 2 (two) times daily. 28 tablet 0   SUMAtriptan (IMITREX) 50 MG tablet Take by mouth.     No current facility-administered medications for this visit.    REVIEW OF SYSTEMS:    10 Point review of Systems was done is negative except as noted above.  PHYSICAL EXAMINATION: Vitals:   01/25/23 1430  BP: 120/83  Pulse: 90  Resp: 20  Temp: (!) 97.5 F (36.4 C)  SpO2: 100%     Filed Weights   01/25/23 1430  Weight: 136 lb 12.8 oz (62.1 kg)     .Body mass index is 24.23 kg/m.   GENERAL:alert, in no acute distress and comfortable SKIN: no acute rashes, no significant lesions EYES: conjunctiva are pink and non-injected, sclera anicteric OROPHARYNX: MMM, no exudates, no oropharyngeal erythema or ulceration NECK: supple, no JVD LYMPH:  no palpable lymphadenopathy in the cervical, axillary or inguinal regions LUNGS: clear to auscultation b/l with normal respiratory effort HEART: regular rate & rhythm ABDOMEN:  normoactive bowel sounds , non tender, not distended. Extremity: no pedal edema PSYCH: alert & oriented x 3 with fluent speech NEURO: no focal motor/sensory deficits    LABORATORY DATA:  I have reviewed the data as listed     Latest Ref Rng & Units 01/25/2023    2:03 PM 09/12/2021    1:32 PM 01/03/2021    8:49 AM  CBC  WBC 4.0 - 10.5 K/uL 5.6  5.0  4.2   Hemoglobin 12.0 - 15.0 g/dL 13.4  13.5  13.6   Hematocrit 36.0 - 46.0 % 40.8  40.8  41.6   Platelets 150 - 400 K/uL 195  222  214    . CBC    Component Value Date/Time   WBC 5.6 01/25/2023 1403   WBC 5.0  09/12/2021 1332   RBC 5.07 01/25/2023 1403   HGB 13.4 01/25/2023 1403   HCT 40.8 01/25/2023 1403   PLT 195 01/25/2023 1403   MCV 80.5 01/25/2023 1403   MCH 26.4 01/25/2023 1403   MCHC 32.8 01/25/2023 1403   RDW 15.4 01/25/2023 1403   LYMPHSABS 2.0 01/25/2023 1403   MONOABS 0.3 01/25/2023 1403   EOSABS 0.0 01/25/2023 1403   BASOSABS 0.0 01/25/2023 1403    .    Latest Ref Rng & Units 09/12/2021    1:32 PM 01/03/2021    8:49 AM 04/16/2018   11:17 AM  CMP  Glucose 70 - 99 mg/dL 112  82  75   BUN 6 - 20 mg/dL '10  10  11   '$ Creatinine 0.44 - 1.00 mg/dL 0.75  0.77  0.77   Sodium 135 - 145 mmol/L 140  139  139   Potassium 3.5 - 5.1 mmol/L 3.4  3.9  3.5   Chloride 98 - 111 mmol/L 106  108  105   CO2 22 - 32 mmol/L '25  24  27   '$ Calcium 8.9 - 10.3 mg/dL 9.7  9.4  9.7   Total Protein 6.5 - 8.1 g/dL 7.5  7.9  8.6   Total Bilirubin 0.3 - 1.2 mg/dL 0.6  0.6  0.5   Alkaline Phos 38 - 126 U/L 56  66  50   AST 15 - 41 U/L '12  17  17   '$ ALT 0 - 44 U/L '8  20  13    '$ . Lab Results  Component Value Date   IRON 81 09/12/2021   TIBC 308 09/12/2021   IRONPCTSAT 26 09/12/2021   (Iron and TIBC)  Lab Results  Component Value Date   FERRITIN 24 09/12/2021    . Lab Results  Component Value Date   IRON 81 09/12/2021   TIBC 308 09/12/2021   IRONPCTSAT 26 09/12/2021   (Iron and TIBC)  Lab Results  Component Value Date  FERRITIN 24 09/12/2021      12/24/17 CBC:    RADIOGRAPHIC STUDIES: I have personally reviewed the radiological images as listed and agreed with the findings in the report. No results found.  ASSESSMENT & PLAN:  42 y.o. female with  1.Microcytic Anemia due to severe iron deficiency 2. Severe irion deficiency due to heavy menstrual losses. Not responsive to PO iron replacement 3. Pica symptoms related to Iron deficiency anemia - resolved 4. Sickle Cell trait  PLAN:  -Discussed lab results on 01/25/23 in detail with patient. CBC normal, showed WBC of 5.6K,  hemoglobin of 13.4, and platelets of 195K -recent pelvic ultrasound from 11/09/22 revealed normal findings -other labs pending -Discussed that if iron is stable, may not continue to receive IV iron  FOLLOW-UP: ***  The total time spent in the appointment was *** minutes* .  All of the patient's questions were answered with apparent satisfaction. The patient knows to call the clinic with any problems, questions or concerns.   Sullivan Lone MD MS AAHIVMS [facility] Hematology/Oncology Physician [facility]  .*Total Encounter Time as defined by the Centers for Medicare and Medicaid Services includes, in addition to the face-to-face time of a patient visit (documented in the note above) non-face-to-face time: obtaining and reviewing outside history, ordering and reviewing medications, tests or procedures, care coordination (communications with other health care professionals or caregivers) and documentation in the medical record.   I,Mitra Faeizi,acting as a Education administrator for Sullivan Lone, MD.,have documented all relevant documentation on the behalf of Sullivan Lone, MD,as directed by  Sullivan Lone, MD while in the presence of Sullivan Lone, MD.  ***

## 2023-01-28 ENCOUNTER — Telehealth: Payer: Self-pay | Admitting: Hematology

## 2023-01-28 NOTE — Telephone Encounter (Signed)
Called patient per 3/1 los notes to schedule f/u. Left voicemail with new appointment information and contact details if needing to reschedule.

## 2023-01-31 ENCOUNTER — Encounter: Payer: Self-pay | Admitting: Hematology

## 2023-02-20 ENCOUNTER — Encounter: Payer: Self-pay | Admitting: Obstetrics

## 2023-02-20 ENCOUNTER — Ambulatory Visit: Payer: Medicaid Other | Admitting: Obstetrics

## 2023-02-20 VITALS — Ht 63.0 in | Wt 136.7 lb

## 2023-02-20 DIAGNOSIS — Z3202 Encounter for pregnancy test, result negative: Secondary | ICD-10-CM

## 2023-02-20 DIAGNOSIS — N926 Irregular menstruation, unspecified: Secondary | ICD-10-CM | POA: Diagnosis not present

## 2023-02-20 LAB — POCT URINE PREGNANCY: Preg Test, Ur: NEGATIVE

## 2023-02-20 NOTE — Progress Notes (Signed)
Pt presents for light period and breast tenderness with light abdominal cramping.  Post tubal ligation in 2013. LMP 3/22.

## 2023-02-20 NOTE — Progress Notes (Signed)
Patient ID: Katrina Wiley, female   DOB: 22-Oct-1981, 42 y.o.   MRN: [id number]  Chief Complaint  Patient presents with   Menstrual Problem    HPI Katrina Wiley is a 41 y.o. female.  Complains of abnormally light LMP, breast tenderness and mild lower abdominal cramping.  She has a history of a tubal ligation in 2013. HPI  Past Medical History:  Diagnosis Date   Abnormal vaginal Pap smear    cryo done in Dr Jacelyn Grip office   Allergic rhinitis due to allergen    H/O tubal ligation 04/12/2022   History of migraine headaches    Usually without aura   Hyperlipidemia    no meds, diet controlled   Iron deficiency anemia, unspecified    Iron deficiency; has required IV iron because she did not tolerate oral iron   Preterm labor    Sickle cell trait (HCC)    SVD (spontaneous vaginal delivery)    x 3    Past Surgical History:  Procedure Laterality Date   DILATION AND CURETTAGE OF UTERUS     DILITATION & CURRETTAGE/HYSTROSCOPY WITH HYDROTHERMAL ABLATION N/A 06/13/2017   Procedure: DILATATION & CURETTAGE/HYSTEROSCOPY WITH ATTEMPTED HYDROTHERMAL ABLATION;  Surgeon: Emily Filbert, MD;  Location: [facility];  Service: Gynecology;  Laterality: N/A;   INCISION AND DRAINAGE PERIRECTAL ABSCESS  2008   TUBAL LIGATION     WISDOM TOOTH EXTRACTION      Family History  Problem Relation Age of Onset   Anemia Mother    Hypertension Father    Hypertension Maternal Grandmother    Hyperlipidemia Maternal Grandmother    Stroke Maternal Grandmother    Hypertension Maternal Grandfather    Hyperlipidemia Maternal Grandfather    Diabetes Paternal Grandmother    Cirrhosis Paternal Grandfather    Breast cancer Other     Social History Social History   Tobacco Use   Smoking status: Never   Smokeless tobacco: Never  Vaping Use   Vaping Use: Never used  Substance Use Topics   Alcohol use: Yes    Alcohol/week: 0.0 standard drinks of alcohol    Comment: occasional   Drug use: No    No Known  Allergies  Current Outpatient Medications  Medication Sig Dispense Refill   ibuprofen (ADVIL) 800 MG tablet TAKE 1 TABLET BY MOUTH EVERY 8 HOURS AS NEEDED 30 tablet 5   SUMAtriptan (IMITREX) 50 MG tablet Take by mouth.     doxycycline (VIBRAMYCIN) 100 MG capsule Take 1 capsule (100 mg total) by mouth 2 (two) times daily. 28 capsule 0   HYDROcodone bit-homatropine (HYCODAN) 5-1.5 MG/5ML syrup Take 5 mLs by mouth every 6 (six) hours as needed for cough. 120 mL 0   metroNIDAZOLE (FLAGYL) 500 MG tablet Take 1 tablet (500 mg total) by mouth 2 (two) times daily. 28 tablet 0   No current facility-administered medications for this visit.    Review of Systems Review of Systems Constitutional: negative for fatigue and weight loss Respiratory: negative for cough and wheezing Cardiovascular: negative for chest pain, fatigue and palpitations Gastrointestinal: negative for abdominal pain and change in bowel habits Genitourinary:negative Integument/breast: negative for nipple discharge Musculoskeletal:negative for myalgias Neurological: negative for gait problems and tremors Behavioral/Psych: negative for abusive relationship, depression Endocrine: negative for temperature intolerance      Height 5\' 3"  (1.6 m), weight 136 lb 11.2 oz (62 kg), last menstrual period 02/15/2023.  Physical Exam Physical Exam General:   alert  Skin:   no rash or  abnormalities  Lungs:   clear to auscultation bilaterally  Heart:   regular rate and rhythm, S1, S2 normal, no murmur, click, rub or gallop  Breasts:   normal without suspicious masses, skin or nipple changes or axillary nodes  Abdomen:  normal findings: no organomegaly, soft, non-tender and no hernia  Pelvic exam:  Deferred   I have spent a total of 20 minutes of face-to-face time, excluding clinical staff time, reviewing notes and preparing to see patient, ordering tests and/or medications, and counseling the patient.   Data Reviewed UPT:   Negative  Assessment     1. Abnormal menstrual period.  Period lighter than usual - mild lower abdominal cramping  2. Breast tenderness Rx: - POCT urine pregnancy :  NEGATIVE  3. History pf tubal ligation    Plan   Follow up if next period abnormal, or if abdominal pain increases.  Ectopic precautions given  Orders Placed This Encounter  Procedures   POCT urine pregnancy    Shelly Bombard, MD 02/20/2023 2:32 PM

## 2023-07-10 ENCOUNTER — Other Ambulatory Visit: Payer: Self-pay | Admitting: Family Medicine

## 2023-07-10 DIAGNOSIS — Z1231 Encounter for screening mammogram for malignant neoplasm of breast: Secondary | ICD-10-CM

## 2023-07-11 ENCOUNTER — Inpatient Hospital Stay (HOSPITAL_BASED_OUTPATIENT_CLINIC_OR_DEPARTMENT_OTHER): Admission: RE | Admit: 2023-07-11 | Payer: Medicaid Other | Source: Ambulatory Visit | Admitting: Radiology

## 2023-07-11 ENCOUNTER — Encounter (HOSPITAL_BASED_OUTPATIENT_CLINIC_OR_DEPARTMENT_OTHER): Payer: Self-pay

## 2023-07-11 DIAGNOSIS — Z1231 Encounter for screening mammogram for malignant neoplasm of breast: Secondary | ICD-10-CM

## 2023-07-22 ENCOUNTER — Other Ambulatory Visit: Payer: Self-pay | Admitting: Family Medicine

## 2023-07-22 DIAGNOSIS — Z1231 Encounter for screening mammogram for malignant neoplasm of breast: Secondary | ICD-10-CM

## 2023-07-25 ENCOUNTER — Ambulatory Visit (HOSPITAL_BASED_OUTPATIENT_CLINIC_OR_DEPARTMENT_OTHER)
Admission: RE | Admit: 2023-07-25 | Discharge: 2023-07-25 | Disposition: A | Discharge status: Home / Self Care | Payer: Medicaid Other | Source: Ambulatory Visit | Attending: Family Medicine | Admitting: Family Medicine

## 2023-07-25 DIAGNOSIS — Z1231 Encounter for screening mammogram for malignant neoplasm of breast: Secondary | ICD-10-CM | POA: Diagnosis not present

## 2023-07-30 ENCOUNTER — Other Ambulatory Visit: Payer: Self-pay | Admitting: Family Medicine

## 2023-07-30 ENCOUNTER — Encounter: Payer: Self-pay | Admitting: Family Medicine

## 2023-07-30 DIAGNOSIS — R928 Other abnormal and inconclusive findings on diagnostic imaging of breast: Secondary | ICD-10-CM

## 2023-08-01 ENCOUNTER — Other Ambulatory Visit: Payer: Self-pay

## 2023-08-01 DIAGNOSIS — D509 Iron deficiency anemia, unspecified: Secondary | ICD-10-CM

## 2023-08-02 ENCOUNTER — Inpatient Hospital Stay: Payer: Medicaid Other | Attending: Hematology

## 2023-08-02 DIAGNOSIS — D509 Iron deficiency anemia, unspecified: Secondary | ICD-10-CM | POA: Diagnosis present

## 2023-08-02 LAB — CBC WITH DIFFERENTIAL (CANCER CENTER ONLY)
Abs Immature Granulocytes: 0.01 10*3/uL (ref 0.00–0.07)
Basophils Absolute: 0 10*3/uL (ref 0.0–0.1)
Basophils Relative: 1 %
Eosinophils Absolute: 0 10*3/uL (ref 0.0–0.5)
Eosinophils Relative: 0 %
HCT: 40 % (ref 36.0–46.0)
Hemoglobin: 13.5 g/dL (ref 12.0–15.0)
Immature Granulocytes: 0 %
Lymphocytes Relative: 32 %
Lymphs Abs: 1.3 10*3/uL (ref 0.7–4.0)
MCH: 26.8 pg (ref 26.0–34.0)
MCHC: 33.8 g/dL (ref 30.0–36.0)
MCV: 79.5 fL — ABNORMAL LOW (ref 80.0–100.0)
Monocytes Absolute: 0.2 10*3/uL (ref 0.1–1.0)
Monocytes Relative: 6 %
Neutro Abs: 2.6 10*3/uL (ref 1.7–7.7)
Neutrophils Relative %: 61 %
Platelet Count: 232 10*3/uL (ref 150–400)
RBC: 5.03 MIL/uL (ref 3.87–5.11)
RDW: 13.1 % (ref 11.5–15.5)
WBC Count: 4.2 10*3/uL (ref 4.0–10.5)
nRBC: 0 % (ref 0.0–0.2)

## 2023-08-02 LAB — FERRITIN: Ferritin: 30 ng/mL (ref 11–307)

## 2023-08-02 LAB — IRON AND IRON BINDING CAPACITY (CC-WL,HP ONLY)
Iron: 75 ug/dL (ref 28–170)
Saturation Ratios: 23 % (ref 10.4–31.8)
TIBC: 332 ug/dL (ref 250–450)
UIBC: 257 ug/dL (ref 148–442)

## 2023-08-02 LAB — CMP (CANCER CENTER ONLY)
ALT: 10 U/L (ref 0–44)
AST: 15 U/L (ref 15–41)
Albumin: 4.4 g/dL (ref 3.5–5.0)
Alkaline Phosphatase: 51 U/L (ref 38–126)
Anion gap: 3 — ABNORMAL LOW (ref 5–15)
BUN: 10 mg/dL (ref 6–20)
CO2: 32 mmol/L (ref 22–32)
Calcium: 9.7 mg/dL (ref 8.9–10.3)
Chloride: 103 mmol/L (ref 98–111)
Creatinine: 0.74 mg/dL (ref 0.44–1.00)
GFR, Estimated: >60 mL/min (ref >60)
Glucose, Bld: 86 mg/dL (ref 70–99)
Potassium: 3.5 mmol/L (ref 3.5–5.1)
Sodium: 138 mmol/L (ref 135–145)
Total Bilirubin: 0.6 mg/dL (ref 0.3–1.2)
Total Protein: 7.5 g/dL (ref 6.5–8.1)

## 2023-08-02 LAB — VITAMIN B12: Vitamin B-12: 452 pg/mL (ref 180–914)

## 2023-08-05 ENCOUNTER — Ambulatory Visit
Admission: RE | Admit: 2023-08-05 | Discharge: 2023-08-05 | Disposition: A | Discharge status: Home / Self Care | Payer: Medicaid Other | Source: Ambulatory Visit | Attending: Family Medicine | Admitting: Family Medicine

## 2023-08-05 DIAGNOSIS — R928 Other abnormal and inconclusive findings on diagnostic imaging of breast: Secondary | ICD-10-CM

## 2023-09-05 NOTE — Progress Notes (Signed)
Cardiology Office Note:  .   Date:  09/09/2023  ID:  Katrina Wiley, DOB October 30, 1981, MRN [id number] PCP: Aliene Beams, MD  [facility] Providers Cardiologist:  Bryan Lemma, MD {  History of Present Illness: .   Katrina Wiley is a 42 y.o. female with a past medical history of palpitations, iron deficiency anemia, palpitations. Patient is followed by Dr. Herbie Baltimore and presents today for evaluation of abnormal EKG  Per chart review, patient was seen by Dr. Herbie Baltimore in 08/2020 for evaluation of chest pain and palpitations.  Chest pain had been occurring on and off for about 2 years.  Palpitations began when she had COVID a few months prior to her appointment. Episodes of palpitations/elevated HR were self-limiting and only lasted a few seconds at a time, so monitor was deferred. Patient did undergo echocardiogram on 10/11/20 that showed EF 60-65%, no regional wall motion abnormalities, normal LV diastolic parameters, normal RV function, no significant valvular disease.  On interview, patient reports that her primary care asked her to see cardiology due to abnormal EKG. Was told that she had an "enlarged heart". She overall feels fine. Has rare episodes of palpitations which occur a few times per month. Episodes only last for a few seconds and occur randomly. 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 She denies shortness of breath on exertion. Had one episode of chest pain a few weeks ago that went away with ibuprofen. No other episodes of chest pain. Denies history of HTN and reports that her BP has been normal at her past few doctor's visits. Denies syncope, near syncope, dizziness.   ROS: Denies chest pain on exertion, DOE, orthopnea, ankle edema, syncope, near syncope, dizziness.   Studies Reviewed: .   Cardiac Studies & Procedures       ECHOCARDIOGRAM  ECHOCARDIOGRAM COMPLETE 10/11/2020  Narrative ECHOCARDIOGRAM REPORT    Patient Name:   Katrina Wiley Date  of Exam: 10/11/2020 Medical Rec #:  [id number]         Height:       63.0 in Accession #:    [id number]        Weight:       132.6 lb Date of Birth:  05/19/1981         BSA:          1.624 m Patient Age:    39 years          BP:           111/82 mmHg Patient Gender: F                 HR:           87 bpm. Exam Location:  [address]  Procedure: 2D Echo, Cardiac Doppler and Color Doppler  Indications:    R94.31 Abnormal ECG  History:        Patient has prior history of Echocardiogram examinations and Patient has no prior history of Echocardiogram examinations. Signs/Symptoms:Chest Pain and Palpitations. COVID 19 (2021).  Sonographer:    Clearence Ped RCS Referring Phys: 57 DAVID W HARDING  IMPRESSIONS   1. Left ventricular ejection fraction, by estimation, is 60 to 65%. The left ventricle has normal function. The left ventricle has no regional wall motion abnormalities. Left ventricular diastolic parameters were normal. 2. Right ventricular systolic function is normal. The right ventricular size is normal. 3. The mitral valve is normal in structure. No evidence of mitral valve regurgitation. No  evidence of mitral stenosis. 4. The aortic valve is normal in structure. Aortic valve regurgitation is not visualized. No aortic stenosis is present. 5. The inferior vena cava is normal in size with greater than 50% respiratory variability, suggesting right atrial pressure of 3 mmHg.  FINDINGS Left Ventricle: Left ventricular ejection fraction, by estimation, is 60 to 65%. The left ventricle has normal function. The left ventricle has no regional wall motion abnormalities. Global longitudinal strain performed but not reported based on interpreter judgement due to suboptimal tracking. The left ventricular internal cavity size was normal in size. There is no left ventricular hypertrophy. Left ventricular diastolic parameters were normal.  Right Ventricle: The right ventricular size is normal.  No increase in right ventricular wall thickness. Right ventricular systolic function is normal.  Left Atrium: Left atrial size was normal in size.  Right Atrium: Right atrial size was normal in size.  Pericardium: There is no evidence of pericardial effusion.  Mitral Valve: The mitral valve is normal in structure. No evidence of mitral valve regurgitation. No evidence of mitral valve stenosis.  Tricuspid Valve: The tricuspid valve is normal in structure. Tricuspid valve regurgitation is not demonstrated. No evidence of tricuspid stenosis.  Aortic Valve: The aortic valve is normal in structure. Aortic valve regurgitation is not visualized. No aortic stenosis is present.  Pulmonic Valve: The pulmonic valve was normal in structure. Pulmonic valve regurgitation is not visualized. No evidence of pulmonic stenosis.  Aorta: The aortic root is normal in size and structure.  Venous: The inferior vena cava is normal in size with greater than 50% respiratory variability, suggesting right atrial pressure of 3 mmHg.  IAS/Shunts: No atrial level shunt detected by color flow Doppler.   LEFT VENTRICLE PLAX 2D LVIDd:         4.20 cm LVIDs:         2.70 cm LV PW:         0.90 cm LV IVS:        0.70 cm LVOT diam:     1.90 cm LV SV:         41 LV SV Index:   25 LVOT Area:     2.84 cm   RIGHT VENTRICLE RV Basal diam:  2.50 cm RV S prime:     11.50 cm/s TAPSE (M-mode): 1.7 cm  LEFT ATRIUM             Index       RIGHT ATRIUM          Index LA diam:        2.50 cm 1.54 cm/m  RA Area:     7.41 cm LA Vol (A2C):   26.7 ml 16.44 ml/m RA Volume:   13.40 ml 8.25 ml/m LA Vol (A4C):   28.2 ml 17.37 ml/m LA Biplane Vol: 29.5 ml 18.17 ml/m AORTIC VALVE LVOT Vmax:   74.40 cm/s LVOT Vmean:  47.200 cm/s LVOT VTI:    0.145 m  AORTA Ao Root diam: 2.90 cm Ao Asc diam:  2.60 cm  MITRAL VALVE MV Area (PHT):             SHUNTS MV Decel Time:             Systemic VTI:  0.14 m MV E velocity:  68.10 cm/s  Systemic Diam: 1.90 cm MV A velocity: 41.60 cm/s MV E/A ratio:  1.64  Mihai Croitoru MD Electronically signed by Thurmon Fair MD Signature Date/Time: 10/11/2020/4:40:31 PM    Final  Risk Assessment/Calculations:             Physical Exam:   VS:  BP 118/62   Pulse 91   Ht 5\' 3"  (1.6 m)   Wt 132 lb (59.9 kg)   SpO2 99%   BMI 23.38 kg/m    Wt Readings from Last 3 Encounters:  09/09/23 132 lb (59.9 kg)  02/20/23 136 lb 11.2 oz (62 kg)  01/25/23 136 lb 12.8 oz (62.1 kg)    GEN: Well nourished, well developed in no acute distress. Sitting comfortably on the exam table  NECK: No JVD CARDIAC: RRR, no murmurs, rubs, gallops. Radial pulses 2+ bilaterally  RESPIRATORY:  Clear to auscultation without rales, wheezing or rhonchi. Normal work of breathing on room air  ABDOMEN: Soft, non-tender, non-distended EXTREMITIES:  No edema in BLE; No deformity   ASSESSMENT AND PLAN: .    Abnormal EKG  - Patient's PCP ordered an EKG in 11/2022 that showed sinus rhythm, LVH with secondary repolarization abnormality, nonspecific ST-T changes. She was instructed to follow up with cardiology  - EKG today shows sinus rhythm, LVH, nonspecific T wave changes  - Patient denies syncope, near syncope, chest pain, sob. BP is well controlled. No murmur on exam. No family history of cardiac issues  - Ordered echocardiogram   Palpitations - Patient reports rare episodes of palpitations. Occur a few times per month and only last a few seconds at a time - Palpitations do not bother her very much  - As palpitations are brief and infrequent, hold off on monitor for now. Suspect symptomatic PVCs. Discussed triggers for palpitations including caffeine, dehydration, stress, poor sleep     Dispo: 1 year   Signed, Jonita Albee, PA-C

## 2023-09-09 ENCOUNTER — Ambulatory Visit: Payer: Medicaid Other | Attending: Cardiology | Admitting: Cardiology

## 2023-09-09 ENCOUNTER — Encounter: Payer: Self-pay | Admitting: Cardiology

## 2023-09-09 VITALS — BP 118/62 | HR 91 | Ht 63.0 in | Wt 132.0 lb

## 2023-09-09 DIAGNOSIS — R002 Palpitations: Secondary | ICD-10-CM

## 2023-09-09 DIAGNOSIS — I517 Cardiomegaly: Secondary | ICD-10-CM | POA: Diagnosis not present

## 2023-09-09 NOTE — Patient Instructions (Signed)
Medication Instructions:  No Changes *If you need a refill on your cardiac medications before your next appointment, please call your pharmacy*   Lab Work: No Labs.   Testing/Procedures: Your physician has requested that you have an echocardiogram. Echocardiography is a painless test that uses sound waves to create images of your heart. It provides your doctor with information about the size and shape of your heart and how well your heart's chambers and valves are working. This procedure takes approximately one hour. There are no restrictions for this procedure. Please do NOT wear cologne, perfume, aftershave, or lotions (deodorant is allowed). Please arrive 15 minutes prior to your appointment time.   Follow-Up: At [facility], you and your health needs are our priority.  As part of our continuing mission to provide you with exceptional heart care, we have created designated Provider Care Teams.  These Care Teams include your primary Cardiologist (physician) and Advanced Practice Providers (APPs -  Physician Assistants and Nurse Practitioners) who all work together to provide you with the care you need, when you need it.  We recommend signing up for the patient portal called "MyChart".  Sign up information is provided on this After Visit Summary.  MyChart is used to connect with patients for Virtual Visits (Telemedicine).  Patients are able to view lab/test results, encounter notes, upcoming appointments, etc.  Non-urgent messages can be sent to your provider as well.   To learn more about what you can do with MyChart, go to [email or web address].    Your next appointment:   1 year(s)  Provider:   Bryan Lemma, MD

## 2023-09-30 ENCOUNTER — Ambulatory Visit (HOSPITAL_COMMUNITY): Payer: Medicaid Other | Attending: Cardiology

## 2023-09-30 DIAGNOSIS — I517 Cardiomegaly: Secondary | ICD-10-CM | POA: Insufficient documentation

## 2023-09-30 LAB — ECHOCARDIOGRAM COMPLETE
Area-P 1/2: 3.85 cm2
S' Lateral: 2.9 cm

## 2023-10-02 ENCOUNTER — Telehealth: Payer: Self-pay

## 2023-10-02 NOTE — Telephone Encounter (Signed)
Called patient advised of below they verbalized understanding.

## 2023-10-02 NOTE — Telephone Encounter (Signed)
-----   Message from Jonita Albee sent at 10/01/2023 12:31 PM EST ----- Please tell patient that their echocardiogram showed EF normal at 60-65%. There were no regional wall motion abnormalities. Heart relaxes normally (normal diastolic parameters) and there is no unusual thickening of the heart muscle (no left ventricular hypertrophy). RV function and size are normal. No significant valvular abnormalities. Overall, great result!  Thanks [employer]

## 2023-10-07 ENCOUNTER — Other Ambulatory Visit: Payer: Self-pay | Admitting: Obstetrics

## 2023-10-07 DIAGNOSIS — N946 Dysmenorrhea, unspecified: Secondary | ICD-10-CM

## 2023-10-21 ENCOUNTER — Other Ambulatory Visit (HOSPITAL_COMMUNITY)
Admission: RE | Admit: 2023-10-21 | Discharge: 2023-10-21 | Disposition: A | Discharge status: Home / Self Care | Payer: Medicaid Other | Source: Ambulatory Visit | Attending: Obstetrics and Gynecology | Admitting: Obstetrics and Gynecology

## 2023-10-21 ENCOUNTER — Ambulatory Visit: Payer: Medicaid Other

## 2023-10-21 DIAGNOSIS — Z3202 Encounter for pregnancy test, result negative: Secondary | ICD-10-CM

## 2023-10-21 DIAGNOSIS — N926 Irregular menstruation, unspecified: Secondary | ICD-10-CM

## 2023-10-21 DIAGNOSIS — N898 Other specified noninflammatory disorders of vagina: Secondary | ICD-10-CM | POA: Insufficient documentation

## 2023-10-21 LAB — POCT URINE PREGNANCY: Preg Test, Ur: NEGATIVE

## 2023-10-21 NOTE — Progress Notes (Signed)
SUBJECTIVE:  42 y.o. female complains of white vaginal discharge for 3-4 day(s). Denies abnormal vaginal bleeding or significant pelvic pain or fever. No UTI symptoms. Denies history of known exposure to STD.  No LMP recorded.  OBJECTIVE:  She appears well, afebrile.   ASSESSMENT:  Vaginal Discharge  Vaginal Odor   PLAN:  GC, chlamydia, trichomonas, BVAG, CVAG probe sent to lab. Treatment: To be determined once lab results are received ROV prn if symptoms persist or worsen.     Katrina Wiley here for a UPT. Pt had a negative upt at home. LMP is 10/07/23.     UPT in office Negative.    Negative UPT in office

## 2023-10-22 LAB — CERVICOVAGINAL ANCILLARY ONLY
Bacterial Vaginitis (gardnerella): NEGATIVE
Candida Glabrata: NEGATIVE
Candida Vaginitis: NEGATIVE
Chlamydia: NEGATIVE
Comment: NEGATIVE
Comment: NEGATIVE
Comment: NEGATIVE
Comment: NEGATIVE
Comment: NEGATIVE
Comment: NORMAL
Neisseria Gonorrhea: NEGATIVE
Trichomonas: NEGATIVE

## 2024-01-13 ENCOUNTER — Telehealth: Payer: Self-pay | Admitting: *Deleted

## 2024-01-13 NOTE — Telephone Encounter (Signed)
 RTC regarding possible BV and request for meds other than Flagyl pills. On further discussion, pt reports abdominal pain. Advised self swab if desired. Advised appt with provider for evaluation of continued abdominal pain. Pt verbalized understanding. Call transferred to schedulers.

## 2024-01-15 ENCOUNTER — Ambulatory Visit: Payer: Medicaid Other | Admitting: Family Medicine

## 2024-01-16 ENCOUNTER — Other Ambulatory Visit (HOSPITAL_COMMUNITY)
Admission: RE | Admit: 2024-01-16 | Discharge: 2024-01-16 | Disposition: A | Discharge status: Home / Self Care | Payer: Medicaid Other | Source: Ambulatory Visit | Attending: Obstetrics and Gynecology | Admitting: Obstetrics and Gynecology

## 2024-01-16 ENCOUNTER — Ambulatory Visit (INDEPENDENT_AMBULATORY_CARE_PROVIDER_SITE_OTHER): Payer: Medicaid Other

## 2024-01-16 VITALS — BP 129/86 | HR 76

## 2024-01-16 DIAGNOSIS — N898 Other specified noninflammatory disorders of vagina: Secondary | ICD-10-CM | POA: Diagnosis present

## 2024-01-16 NOTE — Progress Notes (Signed)
.  SUBJECTIVE:  43 y.o. female complains of white vaginal discharge for 2 day(s). Denies abnormal vaginal bleeding or significant pelvic pain or fever. No UTI symptoms. Denies history of known exposure to STD.  LMP: 12/24/23 (Approximate)  OBJECTIVE:  She appears well, afebrile. Urine dipstick: not done.  ASSESSMENT:  Vaginal Discharge  Vaginal Odor   PLAN:  GC, chlamydia, trichomonas, BVAG, CVAG probe sent to lab. Treatment: To be determined once lab results are received ROV prn if symptoms persist or worsen.

## 2024-01-17 LAB — CERVICOVAGINAL ANCILLARY ONLY
Bacterial Vaginitis (gardnerella): POSITIVE — AB
Candida Glabrata: NEGATIVE
Candida Vaginitis: NEGATIVE
Chlamydia: NEGATIVE
Comment: NEGATIVE
Comment: NEGATIVE
Comment: NEGATIVE
Comment: NEGATIVE
Comment: NEGATIVE
Comment: NORMAL
Neisseria Gonorrhea: NEGATIVE
Trichomonas: NEGATIVE

## 2024-01-20 ENCOUNTER — Encounter: Payer: Self-pay | Admitting: Obstetrics and Gynecology

## 2024-01-20 MED ORDER — METRONIDAZOLE 500 MG PO TABS: 500.0000 mg | ORAL_TABLET | Freq: Two times a day (BID) | ORAL | 0 refills | Status: AC

## 2024-01-20 NOTE — Addendum Note (Signed)
 Addended by: Catalina Antigua on: 01/20/2024 09:44 AM   Modules accepted: Orders

## 2024-01-22 ENCOUNTER — Ambulatory Visit: Payer: Medicaid Other | Admitting: Obstetrics & Gynecology

## 2024-01-30 ENCOUNTER — Other Ambulatory Visit: Payer: Self-pay

## 2024-01-30 DIAGNOSIS — D509 Iron deficiency anemia, unspecified: Secondary | ICD-10-CM

## 2024-01-31 ENCOUNTER — Inpatient Hospital Stay: Payer: Medicaid Other | Admitting: Hematology

## 2024-01-31 ENCOUNTER — Inpatient Hospital Stay: Payer: Medicaid Other | Attending: Hematology

## 2024-01-31 DIAGNOSIS — N920 Excessive and frequent menstruation with regular cycle: Secondary | ICD-10-CM | POA: Insufficient documentation

## 2024-01-31 DIAGNOSIS — D500 Iron deficiency anemia secondary to blood loss (chronic): Secondary | ICD-10-CM | POA: Insufficient documentation

## 2024-02-17 ENCOUNTER — Inpatient Hospital Stay: Admitting: Hematology

## 2024-02-17 ENCOUNTER — Ambulatory Visit: Admitting: Hematology

## 2024-02-17 ENCOUNTER — Inpatient Hospital Stay

## 2024-02-17 ENCOUNTER — Other Ambulatory Visit

## 2024-02-17 VITALS — BP 121/82 | HR 77 | Temp 97.9°F | Resp 16 | Ht 63.0 in | Wt 134.8 lb

## 2024-02-17 DIAGNOSIS — D509 Iron deficiency anemia, unspecified: Secondary | ICD-10-CM

## 2024-02-17 DIAGNOSIS — D500 Iron deficiency anemia secondary to blood loss (chronic): Secondary | ICD-10-CM | POA: Diagnosis present

## 2024-02-17 DIAGNOSIS — N920 Excessive and frequent menstruation with regular cycle: Secondary | ICD-10-CM | POA: Diagnosis not present

## 2024-02-17 LAB — CBC WITH DIFFERENTIAL (CANCER CENTER ONLY)
Abs Immature Granulocytes: 0 10*3/uL (ref 0.00–0.07)
Basophils Absolute: 0 10*3/uL (ref 0.0–0.1)
Basophils Relative: 1 %
Eosinophils Absolute: 0 10*3/uL (ref 0.0–0.5)
Eosinophils Relative: 1 %
HCT: 41 % (ref 36.0–46.0)
Hemoglobin: 13.5 g/dL (ref 12.0–15.0)
Immature Granulocytes: 0 %
Lymphocytes Relative: 28 %
Lymphs Abs: 1.1 10*3/uL (ref 0.7–4.0)
MCH: 26.3 pg (ref 26.0–34.0)
MCHC: 32.9 g/dL (ref 30.0–36.0)
MCV: 79.8 fL — ABNORMAL LOW (ref 80.0–100.0)
Monocytes Absolute: 0.2 10*3/uL (ref 0.1–1.0)
Monocytes Relative: 5 %
Neutro Abs: 2.5 10*3/uL (ref 1.7–7.7)
Neutrophils Relative %: 65 %
Platelet Count: 250 10*3/uL (ref 150–400)
RBC: 5.14 MIL/uL — ABNORMAL HIGH (ref 3.87–5.11)
RDW: 13.4 % (ref 11.5–15.5)
WBC Count: 3.8 10*3/uL — ABNORMAL LOW (ref 4.0–10.5)
nRBC: 0 % (ref 0.0–0.2)

## 2024-02-17 LAB — VITAMIN B12: Vitamin B-12: 331 pg/mL (ref 180–914)

## 2024-02-17 LAB — CMP (CANCER CENTER ONLY)
ALT: 8 U/L (ref 0–44)
AST: 14 U/L — ABNORMAL LOW (ref 15–41)
Albumin: 4.5 g/dL (ref 3.5–5.0)
Alkaline Phosphatase: 47 U/L (ref 38–126)
Anion gap: 6 (ref 5–15)
BUN: 9 mg/dL (ref 6–20)
CO2: 27 mmol/L (ref 22–32)
Calcium: 9.5 mg/dL (ref 8.9–10.3)
Chloride: 105 mmol/L (ref 98–111)
Creatinine: 0.8 mg/dL (ref 0.44–1.00)
GFR, Estimated: >60 mL/min (ref >60)
Glucose, Bld: 90 mg/dL (ref 70–99)
Potassium: 3.5 mmol/L (ref 3.5–5.1)
Sodium: 138 mmol/L (ref 135–145)
Total Bilirubin: 0.5 mg/dL (ref 0.0–1.2)
Total Protein: 7.7 g/dL (ref 6.5–8.1)

## 2024-02-17 LAB — IRON AND IRON BINDING CAPACITY (CC-WL,HP ONLY)
Iron: 57 ug/dL (ref 28–170)
Saturation Ratios: 15 % (ref 10.4–31.8)
TIBC: 375 ug/dL (ref 250–450)
UIBC: 318 ug/dL (ref 148–442)

## 2024-02-17 LAB — FERRITIN: Ferritin: 6 ng/mL — ABNORMAL LOW (ref 11–307)

## 2024-02-17 NOTE — Progress Notes (Signed)
 HEMATOLOGY/ONCOLOGY CLINIC NOTE  Date of Service: 02/17/24  Patient Care Team: Aliene Beams, MD as PCP - General (Family Medicine) Marykay Lex, MD as PCP - Cardiology (Cardiology)  CHIEF COMPLAINTS/PURPOSE OF CONSULTATION:  F/u for Iron Deficiency Anemia  HISTORY OF PRESENTING ILLNESS:   Katrina Wiley is a wonderful 43 y.o. female who has been referred to [place] by Dr [name] for evaluation and management of Iron Deficiency Anemia. The pt reports that she is doing well overall.   The pt reports that she has had four pregnancies and has three children, 44, 27 and 42 years old. She notes that she breast fed for 3 months each time. She notes that she had anemia in her pregnancies.  She notes that she had an endometrial ablation one year ago for her heavy periods which were 6 days in total and were heavy for 3 days, and occurred every 21 days. She denies blood transfusions or receiving IV iron in the past. She denies having fibroids at any time. She took PO iron pills, Ferralet, for a couple months and is not sure if this helped her lab work. She had some constipation with PO iron pills and is hesitant to restarting PO Iron. She denies being told of her anemia pre-puberty.   She notes that her periods are still frequent and are heavy for one day even after her endometrial ablation. She is taking oral contraceptives for cycle regulation.   She notes being very tired often. She notes that she has cravings for all purpose flour.   Her mom had a blood transfusion two years ago for her anemia, and her maternal family has sickle cell trait.    Most recent lab results (12/20/17) of CBC  is as follows: all values are WNL except for HGB at 10.5, HCT at 33.9, MCV at 68.2, MCH at 21.0, MCHC at 30.8, RDW at 18.5.  On review of systems, pt reports fatigue, pica, heavy periods, and denies nose bleeds, gum bleeds, other concerns for blood loss, leg swelling and any other symptoms.   On PMHx the  pt reports iron deficiency anemia, sickle cell trait, scoliosis. Endometrial ablation. She denies drug allergies, seasonal allergies, food allergies, bee stings.  On Social Hx the pt reports social ETOH use, and denies smoking. 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 On Family Hx the pt reports maternal sickle cell trait.   INTERVAL HISTORY  Katrina Wiley is a wonderful 43 y.o. female who is here today for evaluation and management of Iron Deficiency Anemia.   Patient was last seen by me on 01/25/2023 and she was doing well overall.   Patient notes she has been doing well overall since our last visit. She notes that her menstrual cycle have been normal without any medication.   She denies any new infection issues, fever, chills, night sweats, unexpected weight loss, back pain, abnormal bleeding, abdominal pain, chest pain, or leg swelling.   She regularly takes multi-vitamin supplement, but denies iron supplement.   MEDICAL HISTORY:  Past Medical History:  Diagnosis Date   Abnormal vaginal Pap smear    cryo done in Dr Verdell Carmine office   Allergic rhinitis due to allergen    H/O tubal ligation 04/12/2022   History of migraine headaches    Usually without aura   Hyperlipidemia    no meds, diet controlled   Iron deficiency anemia, unspecified    Iron deficiency; has required IV iron because she did  not tolerate oral iron   Preterm labor    Sickle cell trait (HCC)    SVD (spontaneous vaginal delivery)    x 3    SURGICAL HISTORY: Past Surgical History:  Procedure Laterality Date   DILATION AND CURETTAGE OF UTERUS     DILITATION & CURRETTAGE/HYSTROSCOPY WITH HYDROTHERMAL ABLATION N/A 06/13/2017   Procedure: DILATATION & CURETTAGE/HYSTEROSCOPY WITH ATTEMPTED HYDROTHERMAL ABLATION;  Surgeon: Allie Bossier, MD;  Location: [facility];  Service: Gynecology;  Laterality: N/A;   INCISION AND DRAINAGE PERIRECTAL ABSCESS  2008   TUBAL LIGATION     WISDOM TOOTH EXTRACTION       SOCIAL HISTORY: Social History   Socioeconomic History   Marital status: Single    Spouse name: Not on file   Number of children: 3   Years of education: Not on file   Highest education level: Associate degree: occupational, Scientist, product/process development, or vocational program  Occupational History   Occupation: Programmer, multimedia: [employer]  Tobacco Use   Smoking status: Never   Smokeless tobacco: Never  Vaping Use   Vaping status: Never Used  Substance and Sexual Activity   Alcohol use: Yes    Alcohol/week: 0.0 standard drinks of alcohol    Comment: occasional   Drug use: No   Sexual activity: Yes    Partners: Male    Birth control/protection: None  Other Topics Concern   Not on file  Social History Narrative   Single mother of 3 daughters: Chanetta Marshall (27), Tanasiah (37), Henry (8)   Never smoked.  Rare wine".   No recreational drugs.   Diet: Eats all types of foods except beef and pork.   Exercise: No set routine, but intermittent walking.   Religion: [facility], enjoys traveling.   Social Drivers of Corporate investment banker Strain: Not on file  Food Insecurity: Not on file  Transportation Needs: Not on file  Physical Activity: Not on file  Stress: Not on file  Social Connections: Not on file  Intimate Partner Violence: Not on file    FAMILY HISTORY: Family History  Problem Relation Age of Onset   Anemia Mother    Hypertension Father    Hypertension Maternal Grandmother    Hyperlipidemia Maternal Grandmother    Stroke Maternal Grandmother    Hypertension Maternal Grandfather    Hyperlipidemia Maternal Grandfather    Diabetes Paternal Grandmother    Cirrhosis Paternal Grandfather    Breast cancer Other     ALLERGIES:  has no known allergies.  MEDICATIONS:  Current Outpatient Medications  Medication Sig Dispense Refill   ibuprofen (ADVIL) 800 MG tablet TAKE 1 TABLET BY MOUTH EVERY 8 HOURS AS NEEDED 30 tablet 5    metroNIDAZOLE (FLAGYL) 500 MG tablet Take 1 tablet (500 mg total) by mouth 2 (two) times daily. 14 tablet 0   No current facility-administered medications for this visit.    REVIEW OF SYSTEMS:    10 Point review of Systems was done is negative except as noted above.   PHYSICAL EXAMINATION: Vitals:   02/17/24 1136  BP: 121/82  Pulse: 77  Resp: 16  Temp: 97.9 F (36.6 C)  SpO2: 100%   Filed Weights   02/17/24 1136  Weight: 134 lb 12.8 oz (61.1 kg)  .Body mass index is 23.88 kg/m. GENERAL:alert, in no acute distress and comfortable SKIN: no acute rashes, no significant lesions EYES: conjunctiva are pink and non-injected, sclera anicteric OROPHARYNX: MMM,  no exudates, no oropharyngeal erythema or ulceration NECK: supple, no JVD LYMPH:  no palpable lymphadenopathy in the cervical, axillary or inguinal regions LUNGS: clear to auscultation b/l with normal respiratory effort HEART: regular rate & rhythm ABDOMEN:  normoactive bowel sounds , non tender, not distended. Extremity: no pedal edema PSYCH: alert & oriented x 3 with fluent speech NEURO: no focal motor/sensory deficits    LABORATORY DATA:  I have reviewed the data as listed     Latest Ref Rng & Units 02/17/2024   11:17 AM 08/02/2023    2:09 PM 01/25/2023    2:03 PM  CBC  WBC 4.0 - 10.5 K/uL 3.8  4.2  5.6   Hemoglobin 12.0 - 15.0 g/dL 16.1  09.6  04.5   Hematocrit 36.0 - 46.0 % 41.0  40.0  40.8   Platelets 150 - 400 K/uL 250  232  195    . CBC    Component Value Date/Time   WBC 3.8 (L) 02/17/2024 1117   WBC 5.0 09/12/2021 1332   RBC 5.14 (H) 02/17/2024 1117   HGB 13.5 02/17/2024 1117   HCT 41.0 02/17/2024 1117   PLT 250 02/17/2024 1117   MCV 79.8 (L) 02/17/2024 1117   MCH 26.3 02/17/2024 1117   MCHC 32.9 02/17/2024 1117   RDW 13.4 02/17/2024 1117   LYMPHSABS 1.1 02/17/2024 1117   MONOABS 0.2 02/17/2024 1117   EOSABS 0.0 02/17/2024 1117   BASOSABS 0.0 02/17/2024 1117    .    Latest Ref Rng & Units  02/17/2024   11:17 AM 08/02/2023    2:09 PM 01/25/2023    2:03 PM  CMP  Glucose 70 - 99 mg/dL 90  86  409   BUN 6 - 20 mg/dL 9  10  6    Creatinine 0.44 - 1.00 mg/dL 8.11  9.14  7.82   Sodium 135 - 145 mmol/L 138  138  138   Potassium 3.5 - 5.1 mmol/L 3.5  3.5  3.5   Chloride 98 - 111 mmol/L 105  103  102   CO2 22 - 32 mmol/L 27  32  28   Calcium 8.9 - 10.3 mg/dL 9.5  9.7  9.6   Total Protein 6.5 - 8.1 g/dL 7.7  7.5  7.6   Total Bilirubin 0.0 - 1.2 mg/dL 0.5  0.6  0.5   Alkaline Phos 38 - 126 U/L 47  51  61   AST 15 - 41 U/L 14  15  14    ALT 0 - 44 U/L 8  10  11     . Lab Results  Component Value Date   IRON 57 02/17/2024   TIBC 375 02/17/2024   IRONPCTSAT 15 02/17/2024   (Iron and TIBC)  Lab Results  Component Value Date   FERRITIN 6 (L) 02/17/2024    . Lab Results  Component Value Date   IRON 57 02/17/2024   TIBC 375 02/17/2024   IRONPCTSAT 15 02/17/2024   (Iron and TIBC)  Lab Results  Component Value Date   FERRITIN 6 (L) 02/17/2024      12/24/17 CBC:    RADIOGRAPHIC STUDIES: I have personally reviewed the radiological images as listed and agreed with the findings in the report. No results found.  ASSESSMENT & PLAN:  43 y.o. female with  1.Microcytic Anemia due to severe iron deficiency 2. Severe irion deficiency due to heavy menstrual losses. Not responsive to PO iron replacement 3. Pica symptoms related to Iron deficiency anemia -  resolved 4. Sickle Cell trait  PLAN: -Discussed lab results from today, 02/17/2024, in detail with the patient. CBC is stable with slightly decreased WBC of 3.8 K.  CMP wnl  Iron labs show ferritin low at 6 and iron saturation of 15% B12 - 331 -will offer her optionf ro IV iron replacement and startng back PO Iron polysaccharide 150mg  po daily -Answered all of patient's questions.   FOLLOW-UP: RTC with PCP  The total time spent in the appointment was 20 minutes* .  All of the patient's questions were answered with  apparent satisfaction. The patient knows to call the clinic with any problems, questions or concerns.   Wyvonnia Lora MD MS AAHIVMS [facility] Hematology/Oncology Physician [facility]  .*Total Encounter Time as defined by the Centers for Medicare and Medicaid Services includes, in addition to the face-to-face time of a patient visit (documented in the note above) non-face-to-face time: obtaining and reviewing outside history, ordering and reviewing medications, tests or procedures, care coordination (communications with other health care professionals or caregivers) and documentation in the medical record.  I,Param Shah,acting as a Neurosurgeon for Wyvonnia Lora, MD.,have documented all relevant documentation on the behalf of Wyvonnia Lora, MD,as directed by  Wyvonnia Lora, MD while in the presence of Wyvonnia Lora, MD.  .I have reviewed the above documentation for accuracy and completeness, and I agree with the above. Johney Maine MD

## 2024-02-23 ENCOUNTER — Encounter: Payer: Self-pay | Admitting: Hematology

## 2024-03-13 NOTE — Progress Notes (Signed)
 Contacted pt per Dr Onesimo: to  let pt know Iron labs show ferritin low at 6 and iron saturation of 15%  B12 - 331  -offered pt option for IV iron replacement and startng back PO Iron polysaccharide 150mg  po daily(OTC)  Pt agreeable to getting IV iron and will start Iron polysaccharides daily.

## 2024-03-19 ENCOUNTER — Other Ambulatory Visit: Payer: Self-pay | Admitting: Hematology

## 2024-03-31 ENCOUNTER — Telehealth: Payer: Self-pay

## 2024-03-31 ENCOUNTER — Other Ambulatory Visit: Payer: Self-pay | Admitting: Hematology

## 2024-03-31 NOTE — Telephone Encounter (Signed)
 Dr. Salomon Cree and [facility], patient will be scheduled as soon as possible.  Auth Submission: NO AUTH NEEDED Site of care: Site of care: [facility] Payer: [employer] medicaid Medication & CPT/J Code(s) submitted: Feraheme (ferumoxytol) [phone] Route of submission (phone, fax, portal):  Phone # Fax # Auth type: Buy/Bill PB Units/visits requested: 510mg  x 2 doses Reference number:  Approval from: 03/31/24 to 08/01/24

## 2024-06-15 ENCOUNTER — Ambulatory Visit

## 2024-06-15 MED ORDER — ACETAMINOPHEN 325 MG PO TABS: 650.0000 mg | ORAL_TABLET | Freq: Once | ORAL | Status: AC

## 2024-06-15 MED ORDER — LORATADINE 10 MG PO TABS: 10.0000 mg | ORAL_TABLET | Freq: Every day | ORAL | Status: AC

## 2024-06-22 ENCOUNTER — Ambulatory Visit

## 2024-07-09 ENCOUNTER — Ambulatory Visit (INDEPENDENT_AMBULATORY_CARE_PROVIDER_SITE_OTHER)

## 2024-07-09 VITALS — BP 103/68 | HR 70 | Temp 98.2°F | Resp 16 | Ht 63.0 in | Wt 130.0 lb

## 2024-07-09 DIAGNOSIS — D509 Iron deficiency anemia, unspecified: Secondary | ICD-10-CM | POA: Diagnosis not present

## 2024-07-09 MED ORDER — ACETAMINOPHEN 325 MG PO TABS
650.0000 mg | ORAL_TABLET | Freq: Once | ORAL | Status: AC
  Administered 2024-07-09: 650 mg via ORAL
  Filled 2024-07-09: qty 2

## 2024-07-09 MED ORDER — LORATADINE 10 MG PO TABS
10.0000 mg | ORAL_TABLET | Freq: Every day | ORAL | Status: DC
  Administered 2024-07-09: 10 mg via ORAL
  Filled 2024-07-09: qty 1

## 2024-07-09 MED ORDER — SODIUM CHLORIDE 0.9 % IV SOLN
510.0000 mg | Freq: Once | INTRAVENOUS | Status: AC
  Administered 2024-07-09: 510 mg via INTRAVENOUS
  Filled 2024-07-09: qty 17

## 2024-07-09 NOTE — Progress Notes (Signed)
 Diagnosis: Iron Deficiency Anemia  Provider:  Praveen Mannam MD  Procedure: IV Infusion  IV Type: Peripheral, IV Location: R Antecubital  Feraheme (Ferumoxytol), Dose: 510 mg  Infusion Start Time: 1345  Infusion Stop Time: 1402  Post Infusion IV Care: Patient declined observation and Peripheral IV Discontinued  Discharge: Condition: Good, Destination: Home . AVS Declined  Performed by:  Maximiano JONELLE Pouch, LPN

## 2024-07-17 ENCOUNTER — Encounter (HOSPITAL_BASED_OUTPATIENT_CLINIC_OR_DEPARTMENT_OTHER): Admitting: Radiology

## 2024-07-17 DIAGNOSIS — Z1231 Encounter for screening mammogram for malignant neoplasm of breast: Secondary | ICD-10-CM

## 2024-07-18 ENCOUNTER — Inpatient Hospital Stay (HOSPITAL_BASED_OUTPATIENT_CLINIC_OR_DEPARTMENT_OTHER): Admission: RE | Admit: 2024-07-18 | Source: Ambulatory Visit | Admitting: Radiology

## 2024-07-20 ENCOUNTER — Ambulatory Visit

## 2024-07-20 VITALS — BP 108/72 | HR 82 | Temp 98.4°F | Resp 20 | Ht 63.0 in | Wt 131.4 lb

## 2024-07-20 DIAGNOSIS — D500 Iron deficiency anemia secondary to blood loss (chronic): Secondary | ICD-10-CM | POA: Diagnosis not present

## 2024-07-20 DIAGNOSIS — N920 Excessive and frequent menstruation with regular cycle: Secondary | ICD-10-CM

## 2024-07-20 DIAGNOSIS — D509 Iron deficiency anemia, unspecified: Secondary | ICD-10-CM

## 2024-07-20 MED ORDER — SODIUM CHLORIDE 0.9 % IV SOLN
510.0000 mg | Freq: Once | INTRAVENOUS | Status: AC
  Administered 2024-07-20: 510 mg via INTRAVENOUS
  Filled 2024-07-20: qty 17

## 2024-07-20 MED ORDER — LORATADINE 10 MG PO TABS
10.0000 mg | ORAL_TABLET | Freq: Every day | ORAL | Status: DC
  Administered 2024-07-20: 10 mg via ORAL
  Filled 2024-07-20: qty 1

## 2024-07-20 MED ORDER — ACETAMINOPHEN 325 MG PO TABS
650.0000 mg | ORAL_TABLET | Freq: Once | ORAL | Status: AC
  Administered 2024-07-20: 650 mg via ORAL
  Filled 2024-07-20: qty 2

## 2024-07-20 NOTE — Progress Notes (Signed)
 Diagnosis: Iron Deficiency Anemia  Provider:  Praveen Mannam MD  Procedure: IV Infusion  IV Type: Peripheral, IV Location: R Antecubital  Feraheme (Ferumoxytol ), Dose: 510 mg  Infusion Start Time: 0952  Infusion Stop Time: 1015  Post Infusion IV Care: Patient declined observation and Peripheral IV Discontinued  Discharge: Condition: Good, Destination: Home . AVS Declined  Performed by:  Maximiano JONELLE Pouch, LPN

## 2024-08-31 ENCOUNTER — Other Ambulatory Visit: Payer: Self-pay | Admitting: Family Medicine

## 2024-08-31 DIAGNOSIS — Z1231 Encounter for screening mammogram for malignant neoplasm of breast: Secondary | ICD-10-CM

## 2024-09-07 ENCOUNTER — Ambulatory Visit: Admitting: Obstetrics

## 2024-09-07 ENCOUNTER — Encounter (HOSPITAL_BASED_OUTPATIENT_CLINIC_OR_DEPARTMENT_OTHER): Admitting: Radiology

## 2024-09-07 DIAGNOSIS — Z1231 Encounter for screening mammogram for malignant neoplasm of breast: Secondary | ICD-10-CM

## 2024-09-08 ENCOUNTER — Encounter (HOSPITAL_BASED_OUTPATIENT_CLINIC_OR_DEPARTMENT_OTHER): Admitting: Radiology

## 2024-09-08 ENCOUNTER — Ambulatory Visit: Admitting: Obstetrics

## 2024-09-08 DIAGNOSIS — Z1231 Encounter for screening mammogram for malignant neoplasm of breast: Secondary | ICD-10-CM

## 2024-09-14 ENCOUNTER — Ambulatory Visit: Admitting: Obstetrics

## 2024-10-21 ENCOUNTER — Ambulatory Visit
Admission: RE | Admit: 2024-10-21 | Discharge: 2024-10-21 | Disposition: A | Discharge status: Home / Self Care | Source: Ambulatory Visit | Attending: Family Medicine | Admitting: Family Medicine

## 2024-10-21 DIAGNOSIS — Z1231 Encounter for screening mammogram for malignant neoplasm of breast: Secondary | ICD-10-CM

## 2024-10-26 ENCOUNTER — Encounter: Payer: Self-pay | Admitting: Obstetrics

## 2024-10-26 ENCOUNTER — Other Ambulatory Visit (HOSPITAL_COMMUNITY)
Admission: RE | Admit: 2024-10-26 | Discharge: 2024-10-26 | Disposition: A | Source: Ambulatory Visit | Attending: Obstetrics | Admitting: Obstetrics

## 2024-10-26 ENCOUNTER — Ambulatory Visit: Admitting: Obstetrics

## 2024-10-26 VITALS — BP 128/88 | HR 89 | Ht 63.0 in | Wt 135.4 lb

## 2024-10-26 DIAGNOSIS — N898 Other specified noninflammatory disorders of vagina: Secondary | ICD-10-CM | POA: Diagnosis not present

## 2024-10-26 DIAGNOSIS — N926 Irregular menstruation, unspecified: Secondary | ICD-10-CM

## 2024-10-26 DIAGNOSIS — Z01419 Encounter for gynecological examination (general) (routine) without abnormal findings: Secondary | ICD-10-CM | POA: Diagnosis not present

## 2024-10-26 DIAGNOSIS — N946 Dysmenorrhea, unspecified: Secondary | ICD-10-CM

## 2024-10-26 MED ORDER — IBUPROFEN 800 MG PO TABS: 800.0000 mg | ORAL_TABLET | Freq: Three times a day (TID) | ORAL | 5 refills | Status: AC | PRN

## 2024-10-26 MED ORDER — VITAFOL ULTRA 29-0.6-0.4-200 MG PO CAPS: 1.0000 | ORAL_CAPSULE | Freq: Every day | ORAL | 4 refills | Status: AC

## 2024-10-26 NOTE — Progress Notes (Signed)
 Pt presents for annual and irregular periods. Pt states that her period for Sept. Oct. And Nov. Lasted for a month. Pt. Wants all std testing. Pt haws no other questions or concerns at this time.

## 2024-10-26 NOTE — Progress Notes (Signed)
 Subjective:        Katrina Wiley is a 43 y.o. female here for a routine exam.  Current complaints: Irregular periods.    Personal health questionnaire:  Is patient [name], have a family history of breast and/or ovarian cancer: no Is there a family history of uterine cancer diagnosed at age < 80, gastrointestinal cancer, urinary tract cancer, family member who is a Personnel Officer syndrome-associated carrier: no Is the patient overweight and hypertensive, family history of diabetes, personal history of gestational diabetes, preeclampsia or PCOS: no Is patient over 19, have PCOS,  family history of premature CHD under age 70, diabetes, smoke, have hypertension or peripheral artery disease:  no At any time, has a partner hit, kicked or otherwise hurt or frightened you?: no Over the past 2 weeks, have you felt down, depressed or hopeless?: no Over the past 2 weeks, have you felt little interest or pleasure in doing things?:no   Gynecologic History Patient's last menstrual period was 09/27/2024 (exact date). Contraception: none Last Pap: 2023. Results were: normal Last mammogram: 2025. Results were: normal  Obstetric History OB History  Gravida Para Term Preterm AB Living  4 3 2 1 1 2   SAB IAB Ectopic Multiple Live Births   1       # Outcome Date GA Lbr Len/2nd Weight Sex Type Anes PTL Lv  4 Term      Vag-Spont        Birth Comments: System Generated. Please review and update pregnancy details.  3 IAB           2 Preterm      Vag-Spont     1 Term      Vag-Spont       Past Medical History:  Diagnosis Date   Abnormal vaginal Pap smear    cryo done in Dr Gwendel office   Allergic rhinitis due to allergen    H/O tubal ligation 04/12/2022   History of migraine headaches    Usually without aura   Hyperlipidemia    no meds, diet controlled   Iron deficiency anemia, unspecified    Iron deficiency; has required IV iron because she did not tolerate oral iron   Preterm labor     Sickle cell trait    SVD (spontaneous vaginal delivery)    x 3    Past Surgical History:  Procedure Laterality Date   DILATION AND CURETTAGE OF UTERUS     DILITATION & CURRETTAGE/HYSTROSCOPY WITH HYDROTHERMAL ABLATION N/A 06/13/2017   Procedure: DILATATION & CURETTAGE/HYSTEROSCOPY WITH ATTEMPTED HYDROTHERMAL ABLATION;  Surgeon: Starla Harland BROCKS, MD;  Location: [facility];  Service: Gynecology;  Laterality: N/A;   INCISION AND DRAINAGE PERIRECTAL ABSCESS  2008   TUBAL LIGATION     WISDOM TOOTH EXTRACTION       Current Outpatient Medications:    ibuprofen  (ADVIL ) 800 MG tablet, Take 1 tablet (800 mg total) by mouth every 8 (eight) hours as needed., Disp: 30 tablet, Rfl: 5   Prenat-Fe Poly-Methfol-FA-DHA (VITAFOL ULTRA) 29-0.6-0.4-200 MG CAPS, Take 1 capsule by mouth daily before breakfast., Disp: 90 capsule, Rfl: 4   ibuprofen  (ADVIL ) 800 MG tablet, TAKE 1 TABLET BY MOUTH EVERY 8 HOURS AS NEEDED (Patient not taking: Reported on 10/26/2024), Disp: 30 tablet, Rfl: 5   metroNIDAZOLE  (FLAGYL ) 500 MG tablet, Take 1 tablet (500 mg total) by mouth 2 (two) times daily., Disp: 14 tablet, Rfl: 0 No current facility-administered medications for this visit.  Facility-Administered Medications Ordered in Other  Visits:    acetaminophen  (TYLENOL ) tablet 650 mg, 650 mg, Oral, Once, Kale, Gautam Kishore, MD   loratadine  (CLARITIN ) tablet 10 mg, 10 mg, Oral, Daily, Onesimo, Gautam Kishore, MD No Known Allergies  Social History   Tobacco Use   Smoking status: Never   Smokeless tobacco: Never  Substance Use Topics   Alcohol use: Yes    Alcohol/week: 0.0 standard drinks of alcohol    Comment: occasional    Family History  Problem Relation Age of Onset   Anemia Mother    Hypertension Father    Hypertension Maternal Grandmother    Hyperlipidemia Maternal Grandmother    Stroke Maternal Grandmother    Hypertension Maternal Grandfather    Hyperlipidemia Maternal Grandfather    Diabetes Paternal Grandmother     Cirrhosis Paternal Grandfather    Breast cancer Other       Review of Systems  Constitutional: negative for fatigue and weight loss Respiratory: negative for cough and wheezing Cardiovascular: negative for chest pain, fatigue and palpitations Gastrointestinal: negative for abdominal pain and change in bowel habits Musculoskeletal:negative for myalgias Neurological: negative for gait problems and tremors Behavioral/Psych: negative for abusive relationship, depression Endocrine: negative for temperature intolerance    Genitourinary: positive for abnormal menstrual periods and vaginal discharge..  Negative for genital lesions, hot flashes, sexual problems  Integument/breast: negative for breast lump, breast tenderness, nipple discharge and skin lesion(s)    Objective:       BP 128/88   Pulse 89   Ht 5' 3 (1.6 m)   Wt 135 lb 6.4 oz (61.4 kg)   LMP 09/27/2024 (Exact Date)   BMI 23.99 kg/m  General:   Alert and no distress  Skin:   no rash or abnormalities  Lungs:   clear to auscultation bilaterally  Heart:   regular rate and rhythm, S1, S2 normal, no murmur, click, rub or gallop  Breasts:   normal without suspicious masses, skin or nipple changes or axillary nodes  Abdomen:  normal findings: no organomegaly, soft, non-tender and no hernia  Pelvis:  External genitalia: normal general appearance Urinary system: urethral meatus normal and bladder without fullness, nontender Vaginal: normal without tenderness, induration or masses Cervix: normal appearance Adnexa: normal bimanual exam Uterus: anteverted and non-tender, normal size   Lab Review Urine pregnancy test Labs reviewed yes Radiologic studies reviewed YESI have spent a total of 20 minutes of face-to-face time, excluding clinical staff time, reviewing notes and preparing to see patient, ordering tests and/or medications, and counseling the patient.   Assessment:    1. Encounter for routine gynecological examination  with Papanicolaou smear of cervix (Primary) Rx: - Cytology - PAP( [place]) - Prenat-Fe Poly-Methfol-FA-DHA (VITAFOL ULTRA) 29-0.6-0.4-200 MG CAPS; Take 1 capsule by mouth daily before breakfast.  Dispense: 90 capsule; Refill: 4  2. Vaginal discharge Rx: - HIV antibody (with reflex) - RPR - Hepatitis C Antibody - Hepatitis B Surface AntiGEN - Cervicovaginal ancillary only( [place])  3. Irregular periods Rx: - US  PELVIC COMPLETE WITH TRANSVAGINAL; Future  4. Dysmenorrhea Rx: - ibuprofen  (ADVIL ) 800 MG tablet; Take 1 tablet (800 mg total) by mouth every 8 (eight) hours as needed.  Dispense: 30 tablet; Refill: 5     Plan:    Education reviewed: calcium supplements, depression evaluation, low fat, low cholesterol diet, safe sex/STD prevention, self breast exams, and weight bearing exercise. Follow up in: 3 weeks.   Meds ordered this encounter  Medications   Prenat-Fe Poly-Methfol-FA-DHA (VITAFOL ULTRA) 29-0.6-0.4-200 MG CAPS  Sig: Take 1 capsule by mouth daily before breakfast.    Dispense:  90 capsule    Refill:  4   ibuprofen  (ADVIL ) 800 MG tablet    Sig: Take 1 tablet (800 mg total) by mouth every 8 (eight) hours as needed.    Dispense:  30 tablet    Refill:  5   Orders Placed This Encounter  Procedures   US  PELVIC COMPLETE WITH TRANSVAGINAL    Standing Status:   Future    Expiration Date:   12/27/2025    Reason for Exam (SYMPTOM  OR DIAGNOSIS REQUIRED):   AUB    Preferred imaging location?:   [facility] Ultrasound   HIV antibody (with reflex)   RPR   Hepatitis C Antibody   Hepatitis B Surface AntiGEN    CARLIN RONAL CENTERS, MD, FACOG Attending Obstetrician & Gynecologist, [facility] for [facility], [facility], [place] 10/26/2024

## 2024-10-27 ENCOUNTER — Ambulatory Visit (HOSPITAL_COMMUNITY): Admission: RE | Admit: 2024-10-27 | Discharge: 2024-10-27 | Attending: Obstetrics | Admitting: Obstetrics

## 2024-10-27 DIAGNOSIS — N926 Irregular menstruation, unspecified: Secondary | ICD-10-CM | POA: Diagnosis present

## 2024-10-27 LAB — HEPATITIS B SURFACE ANTIGEN: Hepatitis B Surface Ag: NEGATIVE

## 2024-10-27 LAB — SYPHILIS: RPR W/REFLEX TO RPR TITER AND TREPONEMAL ANTIBODIES, TRADITIONAL SCREENING AND DIAGNOSIS ALGORITHM: RPR Ser Ql: NONREACTIVE

## 2024-10-27 LAB — HEPATITIS C ANTIBODY: Hep C Virus Ab: NONREACTIVE

## 2024-10-27 LAB — HIV ANTIBODY (ROUTINE TESTING W REFLEX): HIV Screen 4th Generation wRfx: NONREACTIVE

## 2024-10-28 LAB — CERVICOVAGINAL ANCILLARY ONLY
Bacterial Vaginitis (gardnerella): POSITIVE — AB
Candida Glabrata: NEGATIVE
Candida Vaginitis: POSITIVE — AB
Chlamydia: NEGATIVE
Comment: NEGATIVE
Comment: NEGATIVE
Comment: NEGATIVE
Comment: NEGATIVE
Comment: NEGATIVE
Comment: NORMAL
Neisseria Gonorrhea: NEGATIVE
Trichomonas: NEGATIVE

## 2024-10-29 ENCOUNTER — Ambulatory Visit: Payer: Self-pay | Admitting: Obstetrics

## 2024-10-29 DIAGNOSIS — B9689 Other specified bacterial agents as the cause of diseases classified elsewhere: Secondary | ICD-10-CM

## 2024-10-29 DIAGNOSIS — B3731 Acute candidiasis of vulva and vagina: Secondary | ICD-10-CM

## 2024-10-29 LAB — CYTOLOGY - PAP
Comment: NEGATIVE
Diagnosis: NEGATIVE
High risk HPV: NEGATIVE

## 2024-10-29 MED ORDER — METRONIDAZOLE 500 MG PO TABS: 500.0000 mg | ORAL_TABLET | Freq: Two times a day (BID) | ORAL | 2 refills | Status: AC

## 2024-10-29 MED ORDER — FLUCONAZOLE 150 MG PO TABS: 150.0000 mg | ORAL_TABLET | Freq: Once | ORAL | 0 refills | Status: AC

## 2024-11-16 ENCOUNTER — Telehealth: Admitting: Obstetrics

## 2024-11-16 ENCOUNTER — Encounter: Payer: Self-pay | Admitting: Obstetrics

## 2024-11-16 DIAGNOSIS — E639 Nutritional deficiency, unspecified: Secondary | ICD-10-CM

## 2024-11-16 DIAGNOSIS — D508 Other iron deficiency anemias: Secondary | ICD-10-CM | POA: Diagnosis not present

## 2024-11-16 DIAGNOSIS — N926 Irregular menstruation, unspecified: Secondary | ICD-10-CM

## 2024-11-16 NOTE — Progress Notes (Signed)
 "   GYNECOLOGY VIRTUAL VISIT ENCOUNTER NOTE  Provider location: [facility]   Patient location: Home  I connected with Katrina Wiley on 11/16/2024 at  9:35 AM EST by MyChart Video Encounter and verified that I am speaking with the correct person using two identifiers.   I discussed the limitations, risks, security and privacy concerns of performing an evaluation and management service virtually and the availability of in person appointments. I also discussed with the patient that there may be a patient responsible charge related to this service. The patient expressed understanding and agreed to proceed.   History:  Katrina Wiley is a 43 y.o. [phone] female being evaluated today for irregular periods.  Ultrasound done.  Presents today for ultrasound results and management recommendations.  She denies any abnormal vaginal discharge, pelvic pain or other concerns.       Past Medical History:  Diagnosis Date   Abnormal vaginal Pap smear    cryo done in Dr Gwendel office   Allergic rhinitis due to allergen    H/O tubal ligation 04/12/2022   History of migraine headaches    Usually without aura   Hyperlipidemia    no meds, diet controlled   Iron deficiency anemia, unspecified    Iron deficiency; has required IV iron because she did not tolerate oral iron   Preterm labor    Sickle cell trait    SVD (spontaneous vaginal delivery)    x 3   Past Surgical History:  Procedure Laterality Date   DILATION AND CURETTAGE OF UTERUS     DILITATION & CURRETTAGE/HYSTROSCOPY WITH HYDROTHERMAL ABLATION N/A 06/13/2017   Procedure: DILATATION & CURETTAGE/HYSTEROSCOPY WITH ATTEMPTED HYDROTHERMAL ABLATION;  Surgeon: Starla Harland BROCKS, MD;  Location: [facility];  Service: Gynecology;  Laterality: N/A;   INCISION AND DRAINAGE PERIRECTAL ABSCESS  2008   TUBAL LIGATION     WISDOM TOOTH EXTRACTION     The following portions of the patient's history were reviewed and updated as  appropriate: allergies, current medications, past family history, past medical history, past social history, past surgical history and problem list.   Health Maintenance:  Normal pap and negative HRHPV on 10-26-2024.  Normal mammogram on 10-21-2024.   Review of Systems:  Pertinent items noted in HPI and remainder of comprehensive ROS otherwise negative.  Physical Exam:   General:  Alert, oriented and cooperative. Patient appears to be in no acute distress.  Mental Status: Normal mood and affect. Normal behavior. Normal judgment and thought content.   Respiratory: Normal respiratory effort, no problems with respiration noted  Rest of physical exam deferred due to type of encounter  Labs and Imaging No results found for this or any previous visit (from the past 2 weeks). MM 3D SCREENING MAMMOGRAM BILATERAL BREAST Result Date: 10/28/2024 CLINICAL DATA:  Screening. EXAM: DIGITAL SCREENING BILATERAL MAMMOGRAM WITH TOMOSYNTHESIS AND CAD TECHNIQUE: Bilateral screening digital craniocaudal and mediolateral oblique mammograms were obtained. Bilateral screening digital breast tomosynthesis was performed. The images were evaluated with computer-aided detection. COMPARISON:  Previous exam(s). ACR Breast Density Category b: There are scattered areas of fibroglandular density. FINDINGS: There are no findings suspicious for malignancy. IMPRESSION: No mammographic evidence of malignancy. A result letter of this screening mammogram will be mailed directly to the patient. RECOMMENDATION: Screening mammogram in one year. (Code:SM-B-01Y) BI-RADS CATEGORY  1: Negative. Electronically Signed   By: Debby Satterfield M.D.   On: 10/28/2024 15:15   US  PELVIC COMPLETE WITH TRANSVAGINAL Result Date: 10/28/2024  CLINICAL DATA:  Abnormal uterine bleeding EXAM: TRANSABDOMINAL AND TRANSVAGINAL ULTRASOUND OF PELVIS TECHNIQUE: Both transabdominal and transvaginal ultrasound examinations of the pelvis were performed. Transabdominal  technique was performed for global imaging of the pelvis including uterus, ovaries, adnexal regions, and pelvic cul-de-sac. It was necessary to proceed with endovaginal exam following the transabdominal exam to visualize the uterus and adnexae in better detail. COMPARISON:  11/09/2022 FINDINGS: Uterus Measurements: 9.1 x 4.7 x 5.8 cm = volume: 129 mL. No fibroids or other mass visualized. Retroflexed position. Several cervical nabothian cysts noted. Endometrium Thickness: 5.2 mm.  No focal abnormality visualized. Right ovary Measurements: 3.7 x 2.1 x 2.2 cm = volume: 8.7 mL. Normal appearance/no adnexal mass. Left ovary Measurements: 2.0 x 1.6 x 1.3 cm = volume: 2.1 mL. Normal appearance/no adnexal mass. Other findings No abnormal free fluid. IMPRESSION: No acute or significant finding by pelvic ultrasound Electronically Signed   By: CHRISTELLA.  Shick M.D.   On: 10/28/2024 08:34       Assessment and Plan:     1. Irregular periods (Primary) - will follow   2. Iron deficiency anemia secondary to inadequate dietary iron intake - taking PNV's       I discussed the assessment and treatment plan with the patient. The patient was provided an opportunity to ask questions and all were answered. The patient agreed with the plan and demonstrated an understanding of the instructions.   The patient was advised to call back or seek an in-person evaluation/go to the ED if the symptoms worsen or if the condition fails to improve as anticipated.  I provided 10 minutes of face-to-face time during this encounter. I also spent 5 minutes dedicated to the care of this patient including pre-visit review of records, post visit ordering of medications and appropriate tests or procedures, coordinating care and documenting this visit encounter.    Carlin Centers, MD, [facility] for [facility], [facility], [place] 11/16/2024 "

## 2024-11-16 NOTE — Progress Notes (Signed)
 S/w pt for virtual visit to discuss u/s results from 10/27/24.

## 2024-11-17 ENCOUNTER — Encounter (HOSPITAL_BASED_OUTPATIENT_CLINIC_OR_DEPARTMENT_OTHER): Admitting: Radiology

## 2024-11-17 DIAGNOSIS — Z1231 Encounter for screening mammogram for malignant neoplasm of breast: Secondary | ICD-10-CM

## 2024-11-20 ENCOUNTER — Ambulatory Visit: Admitting: Obstetrics
